# Patient Record
Sex: Male | Born: 1945 | Race: White | Hispanic: No | Marital: Married | Smoking: Never smoker
Health system: Southern US, Community
[De-identification: ages and names within clinical notes are randomized; demographics above are authoritative.]

## PROBLEM LIST (undated history)

## (undated) DIAGNOSIS — H3530 Unspecified macular degeneration: Secondary | ICD-10-CM

## (undated) DIAGNOSIS — N529 Male erectile dysfunction, unspecified: Secondary | ICD-10-CM

## (undated) DIAGNOSIS — E785 Hyperlipidemia, unspecified: Secondary | ICD-10-CM

## (undated) DIAGNOSIS — M1990 Unspecified osteoarthritis, unspecified site: Secondary | ICD-10-CM

## (undated) DIAGNOSIS — E119 Type 2 diabetes mellitus without complications: Secondary | ICD-10-CM

## (undated) HISTORY — PX: OTHER SURGICAL HISTORY: SHX169

## (undated) HISTORY — PX: EYE SURGERY: SHX253

## (undated) HISTORY — DX: Unspecified macular degeneration: H35.30

---

## 2012-09-22 ENCOUNTER — Other Ambulatory Visit: Payer: Self-pay | Admitting: Endocrinology

## 2012-09-22 DIAGNOSIS — E7800 Pure hypercholesterolemia, unspecified: Secondary | ICD-10-CM

## 2012-09-29 ENCOUNTER — Other Ambulatory Visit: Payer: Self-pay

## 2012-10-06 ENCOUNTER — Ambulatory Visit: Payer: Self-pay | Admitting: Endocrinology

## 2012-10-22 ENCOUNTER — Other Ambulatory Visit (INDEPENDENT_AMBULATORY_CARE_PROVIDER_SITE_OTHER): Payer: 59

## 2012-10-22 DIAGNOSIS — E7800 Pure hypercholesterolemia, unspecified: Secondary | ICD-10-CM

## 2012-10-22 LAB — LIPID PANEL
Total CHOL/HDL Ratio: 4
Triglycerides: 97 mg/dL (ref 0.0–149.0)

## 2012-10-22 LAB — COMPREHENSIVE METABOLIC PANEL
ALT: 17 U/L (ref 0–53)
AST: 18 U/L (ref 0–37)
Albumin: 3.9 g/dL (ref 3.5–5.2)
Alkaline Phosphatase: 51 U/L (ref 39–117)
Chloride: 106 mEq/L (ref 96–112)
Potassium: 4.4 mEq/L (ref 3.5–5.1)
Sodium: 140 mEq/L (ref 135–145)
Total Protein: 6.3 g/dL (ref 6.0–8.3)

## 2012-10-22 LAB — LDL CHOLESTEROL, DIRECT: Direct LDL: 138.1 mg/dL

## 2012-10-29 ENCOUNTER — Encounter: Payer: Self-pay | Admitting: Endocrinology

## 2012-10-29 ENCOUNTER — Ambulatory Visit (INDEPENDENT_AMBULATORY_CARE_PROVIDER_SITE_OTHER): Payer: 59 | Admitting: Endocrinology

## 2012-10-29 VITALS — BP 140/84 | HR 81 | Temp 98.8°F | Resp 12 | Ht 72.0 in | Wt 182.9 lb

## 2012-10-29 DIAGNOSIS — N529 Male erectile dysfunction, unspecified: Secondary | ICD-10-CM

## 2012-10-29 DIAGNOSIS — E1169 Type 2 diabetes mellitus with other specified complication: Secondary | ICD-10-CM

## 2012-10-29 DIAGNOSIS — IMO0001 Reserved for inherently not codable concepts without codable children: Secondary | ICD-10-CM

## 2012-10-29 DIAGNOSIS — E7800 Pure hypercholesterolemia, unspecified: Secondary | ICD-10-CM

## 2012-10-29 LAB — HEMOGLOBIN A1C: Hgb A1c MFr Bld: 6.9 % — ABNORMAL HIGH (ref 4.6–6.5)

## 2012-10-29 MED ORDER — SILDENAFIL CITRATE 100 MG PO TABS: 100.0000 mg | ORAL_TABLET | Freq: Every day | ORAL | Status: DC | PRN

## 2012-10-29 NOTE — Progress Notes (Signed)
Patient ID: Juan Morales, male   DOB: 15-Oct-1945, 67 y.o.   MRN: [id number]  MASAYUKI SAKAI is an 67 y.o. male.   Reason for Appointment: Diabetes follow-up   History of Present Illness   Diagnosis: Type 2 DIABETES MELITUS     He has had relatively mild diabetes which is well-controlled with low-dose Jentadueto He has been taking this for a couple of years, started by his previous physician He has generally been compliant with taking this twice a day On his last visit he was suggested Janumet for convenience and taking one a day but he has not filled the prescription He is generally checking his blood sugars only in the morning and only rarely after meals His A1c has been usually under 7%   Side effects from medications: None Monitors blood glucose: Once a day or less.    Glucometer: Accucheck        Blood Glucose readings: readings before breakfast: 112-140; [facility] pc   Hypoglycemia frequency: Never.          Meals: 3 meals per day.  he thinks he is watching his portions fairly well         Physical activity: exercise: 25 mi/wk             Wt Readings from Last 3 Encounters:  10/29/12 182 lb 14.4 oz (82.963 kg)    Office Visit on 10/29/2012  Component Date Value Range Status  . Hemoglobin A1C 10/29/2012 6.9* 4.6 - 6.5 % Final   Glycemic Control Guidelines for People with Diabetes:Non Diabetic:  <6%Goal of Therapy: <7%Additional Action Suggested:  >8%       Medication List       This list is accurate as of: 10/29/12 11:59 PM.  Always use your most recent med list.               JENTADUETO 2.5-500 MG Tabs  Generic drug:  Linagliptin-Metformin HCl     sildenafil 100 MG tablet  Commonly known as:  VIAGRA  Take 1 tablet (100 mg total) by mouth daily as needed for erectile dysfunction.     SitaGLIPtin-MetFORMIN HCl [phone] MG Tb24  Take 100-1,000 mg by mouth daily.        Allergies: No Known Allergies  No past medical history on file.  No past surgical history on  file.  No family history on file.  Social History:  reports that he has never smoked. He does not have any smokeless tobacco history on file. His alcohol and drug histories are not on file.  Review of Systems:  No history of hypertension  HYPERLIPIDEMIA: The lipid abnormality consists of elevated LDL. He has been informed of his hypercholesterolemia on his previous visits He has been reluctant to take any medications and still insists on wanting to try diet before medications  No history of tingling or numbness in his feet  He has had long-standing erectile dysfunction, previously has tried Viagra with benefit. He wants to get a prescription for 20 tablets per month to be ordered from [place]      Examination:   BP 140/84  Pulse 81  Temp(Src) 98.8 F (37.1 C)  Resp 12  Ht 6' (1.829 m)  Wt 182 lb 14.4 oz (82.963 kg)  BMI 24.8 kg/m2  SpO2 99%  Body mass index is 24.8 kg/(m^2).   ASSESSMENT/ PLAN::   Diabetes type 2   The patient's diabetes control appears to be fairly well controlled although he does not  monitor after meals Will need to check A1c today  HYPERLIPIDEMIA: LDL is significantly high but he refuses medications. Discussed benefits of statin drugs regardless of baseline cholesterol level and given detailed information on low saturated fat diet, benefits of statin drugs, diabetes and heart disease  Lailie Smead 10/31/2012, 12:54 PM

## 2012-10-29 NOTE — Patient Instructions (Signed)
Please check blood sugars at least half the time about 2 hours after any meal and as directed on waking up.   Please bring blood sugar monitor to each visit  

## 2012-11-10 ENCOUNTER — Encounter: Payer: Self-pay | Admitting: Endocrinology

## 2012-11-10 ENCOUNTER — Telehealth: Payer: Self-pay | Admitting: *Deleted

## 2012-11-10 MED ORDER — SITAGLIP PHOS-METFORMIN HCL ER 100-1000 MG PO TB24: 100.0000 mg | ORAL_TABLET | Freq: Every day | ORAL | Status: DC

## 2012-11-10 NOTE — Telephone Encounter (Signed)
rx sent

## 2012-12-03 ENCOUNTER — Encounter: Payer: Self-pay | Admitting: Endocrinology

## 2013-01-29 ENCOUNTER — Other Ambulatory Visit: Payer: Self-pay

## 2013-02-02 ENCOUNTER — Other Ambulatory Visit: Payer: 59

## 2013-02-09 ENCOUNTER — Ambulatory Visit: Payer: 59 | Admitting: Endocrinology

## 2013-02-23 ENCOUNTER — Other Ambulatory Visit (INDEPENDENT_AMBULATORY_CARE_PROVIDER_SITE_OTHER): Payer: 59

## 2013-02-23 DIAGNOSIS — IMO0001 Reserved for inherently not codable concepts without codable children: Secondary | ICD-10-CM

## 2013-02-23 DIAGNOSIS — E7800 Pure hypercholesterolemia, unspecified: Secondary | ICD-10-CM

## 2013-02-23 LAB — MICROALBUMIN / CREATININE URINE RATIO
Creatinine,U: 158.5 mg/dL
Microalb, Ur: 1.2 mg/dL (ref 0.0–1.9)

## 2013-02-23 LAB — LIPID PANEL
HDL: 57.2 mg/dL (ref >39.00)
Total CHOL/HDL Ratio: 4
VLDL: 17.8 mg/dL (ref 0.0–40.0)

## 2013-02-23 LAB — URINALYSIS, ROUTINE W REFLEX MICROSCOPIC
Bilirubin Urine: NEGATIVE
Hgb urine dipstick: NEGATIVE
Ketones, ur: NEGATIVE
RBC / HPF: NONE SEEN (ref 0–?)
Urine Glucose: NEGATIVE
Urobilinogen, UA: 0.2 (ref 0.0–1.0)

## 2013-02-23 LAB — BASIC METABOLIC PANEL
CO2: 28 mEq/L (ref 19–32)
Chloride: 107 mEq/L (ref 96–112)
Creatinine, Ser: 1.1 mg/dL (ref 0.4–1.5)

## 2013-02-23 LAB — LDL CHOLESTEROL, DIRECT: Direct LDL: 151.2 mg/dL

## 2013-03-02 ENCOUNTER — Ambulatory Visit: Payer: 59 | Admitting: Endocrinology

## 2013-03-09 ENCOUNTER — Encounter: Payer: Self-pay | Admitting: Endocrinology

## 2013-03-09 ENCOUNTER — Ambulatory Visit (INDEPENDENT_AMBULATORY_CARE_PROVIDER_SITE_OTHER): Payer: 59 | Admitting: Endocrinology

## 2013-03-09 VITALS — BP 126/80 | HR 93 | Temp 98.5°F | Resp 12 | Ht 72.5 in | Wt 188.9 lb

## 2013-03-09 DIAGNOSIS — E7800 Pure hypercholesterolemia, unspecified: Secondary | ICD-10-CM

## 2013-03-09 DIAGNOSIS — IMO0001 Reserved for inherently not codable concepts without codable children: Secondary | ICD-10-CM

## 2013-03-09 NOTE — Patient Instructions (Signed)
Low saturated fat diet  Please check blood sugars at least half the time about 2 hours after any meal and as directed on waking up. Please bring blood sugar monitor to each visit

## 2013-03-09 NOTE — Progress Notes (Signed)
Patient ID: Juan Morales, male   DOB: [birth year], 67 y.o.   MRN: [id number]  Reason for Appointment: Diabetes follow-up   History of Present Illness   Diagnosis: Type 2 DIABETES MELITUS  He was initially diagnosed with prediabetes in 2003. Previously had relatively mild diabetes which was well-controlled with low-dose Jentadueto  For convenience of once a day medication he was switched to Janumet XR and he is taking this now. His A1c has been usually under 7%, in 5/14 was 6.5 However he has been quite noncompliant with his diet and has gained weight since his last visit He thinks that he is eating more sweets and not following his meal plan He is generally checking his blood sugars only in the morning and only rarely after meals Side effects from medications: None  Monitors blood glucose: Once a day or less.    Glucometer: Accucheck        Blood Glucose readings: Fasting about 126, highest upto 160 Hypoglycemia frequency: Never.          Meals: 3 meals per day.  Physical activity: exercise: Walking 25 miles/week            Wt Readings from Last 3 Encounters:  03/09/13 188 lb 14.4 oz (85.684 kg)  10/29/12 182 lb 14.4 oz (82.963 kg)   Lab Results  Component Value Date   HGBA1C 7.0* 02/23/2013   HGBA1C 6.9* 10/29/2012   Lab Results  Component Value Date   MICROALBUR 1.2 02/23/2013   CREATININE 1.1 02/23/2013    No visits with results within 1 Week(s) from this visit. Latest known visit with results is:  Appointment on 02/23/2013  Component Date Value Range Status  . Cholesterol 02/23/2013 229* 0 - 200 mg/dL Final   ATP III Classification       Desirable:  < 200 mg/dL               Borderline High:  200 - 239 mg/dL          High:  > = 409 mg/dL  . Triglycerides 02/23/2013 89.0  0.0 - 149.0 mg/dL Final   Normal:  <811 mg/dLBorderline High:  150 - 199 mg/dL  . HDL 02/23/2013 57.20  >39.00 mg/dL Final  . VLDL 91/47/8295 17.8  0.0 - 40.0 mg/dL Final  . Total CHOL/HDL Ratio  02/23/2013 4   Final                  Men          Women1/2 Average Risk     3.4          3.3Average Risk          5.0          4.42X Average Risk          9.6          7.13X Average Risk          15.0          11.0                      . Microalb, Ur 02/23/2013 1.2  0.0 - 1.9 mg/dL Final  . Creatinine,U 62/13/0865 158.5   Final  . Microalb Creat Ratio 02/23/2013 0.8  0.0 - 30.0 mg/g Final  . Color, Urine 02/23/2013 LT. YELLOW  Yellow;Lt. Yellow Final  . APPearance 02/23/2013 CLEAR  Clear Final  . Specific Gravity, Urine 02/23/2013 1.025  1.000-1.030 Final  .  pH 02/23/2013 6.0  5.0 - 8.0 Final  . Total Protein, Urine 02/23/2013 NEGATIVE  Negative Final  . Urine Glucose 02/23/2013 NEGATIVE  Negative Final  . Ketones, ur 02/23/2013 NEGATIVE  Negative Final  . Bilirubin Urine 02/23/2013 NEGATIVE  Negative Final  . Hgb urine dipstick 02/23/2013 NEGATIVE  Negative Final  . Urobilinogen, UA 02/23/2013 0.2  0.0 - 1.0 Final  . Leukocytes, UA 02/23/2013 NEGATIVE  Negative Final  . Nitrite 02/23/2013 NEGATIVE  Negative Final  . WBC, UA 02/23/2013 0-2/hpf  0-2/hpf Final  . RBC / HPF 02/23/2013 none seen  0-2/hpf Final  . Sodium 02/23/2013 142  135 - 145 mEq/L Final  . Potassium 02/23/2013 4.2  3.5 - 5.1 mEq/L Final  . Chloride 02/23/2013 107  96 - 112 mEq/L Final  . CO2 02/23/2013 28  19 - 32 mEq/L Final  . Glucose, Bld 02/23/2013 166* 70 - 99 mg/dL Final  . BUN 16/12/9602 20  6 - 23 mg/dL Final  . Creatinine, Ser 02/23/2013 1.1  0.4 - 1.5 mg/dL Final  . Calcium 54/11/8117 9.3  8.4 - 10.5 mg/dL Final  . GFR 14/78/2956 72.32  >60.00 mL/min Final  . Hemoglobin A1C 02/23/2013 7.0* 4.6 - 6.5 % Final   Glycemic Control Guidelines for People with Diabetes:Non Diabetic:  <6%Goal of Therapy: <7%Additional Action Suggested:  >8%   . Direct LDL 02/23/2013 151.2   Final   Optimal:  <100 mg/dLNear or Above Optimal:  100-129 mg/dLBorderline High:  130-159 mg/dLHigh:  160-189 mg/dLVery High:  >190 mg/dL       Medication List       This list is accurate as of: 03/09/13  1:02 PM.  Always use your most recent med list.               sildenafil 100 MG tablet  Commonly known as:  VIAGRA  Take 1 tablet (100 mg total) by mouth daily as needed for erectile dysfunction.     SitaGLIPtin-MetFORMIN HCl [phone] MG Tb24  Take 100-1,000 mg by mouth daily.     tobramycin 0.3 % ophthalmic solution  Commonly known as:  TOBREX        Allergies: No Known Allergies  No past medical history on file.  No past surgical history on file.  No family history on file.  Social History:  reports that he has never smoked. He does not have any smokeless tobacco history on file. His alcohol and drug histories are not on file.  Review of Systems:  No history of hypertension  HYPERLIPIDEMIA: The lipid abnormality consists of elevated LDL. He has been informed of his persistent hypercholesterolemia on each visit.  He has been reluctant to take any medications and again insists on wanting to try diet before medications. This is despite explaining him the risks of cardiovascular disease with having diabetes and hypercholesterolemia.  He has had long-standing erectile dysfunction, previously has tried Viagra with benefit. He usually will get a prescription for 20 tablets to be ordered from [place]     Examination:   BP 126/80  Pulse 93  Temp(Src) 98.5 F (36.9 C)  Resp 12  Ht 6' 0.5" (1.842 m)  Wt 188 lb 14.4 oz (85.684 kg)  BMI 25.25 kg/m2  SpO2 98%  Body mass index is 25.25 kg/(m^2).   ASSESSMENT/ PLAN::   Diabetes type 2   The patient's diabetes control appears to be only fair with A1c 7%. Recently has gained weight and is totally of his diet. This is  despite having diet instructions in the past He does exercise regularly Usually he does not monitor after meals He again would like to try improving his diet before increasing the dose of his medication  HYPERLIPIDEMIA: LDL is significantly high  but he again refuses medication. Discussed benefits of statin drugs regardless of baseline cholesterol level and need to reduce his risk. He will reconsider this in 6 months  Arna Luis 03/09/2013, 1:02 PM

## 2013-03-10 ENCOUNTER — Encounter: Payer: Self-pay | Admitting: Endocrinology

## 2013-09-21 ENCOUNTER — Encounter: Payer: Self-pay | Admitting: Endocrinology

## 2013-09-23 ENCOUNTER — Other Ambulatory Visit (INDEPENDENT_AMBULATORY_CARE_PROVIDER_SITE_OTHER): Payer: 59

## 2013-09-23 DIAGNOSIS — E1165 Type 2 diabetes mellitus with hyperglycemia: Principal | ICD-10-CM

## 2013-09-23 DIAGNOSIS — IMO0001 Reserved for inherently not codable concepts without codable children: Secondary | ICD-10-CM

## 2013-09-23 LAB — COMPREHENSIVE METABOLIC PANEL
ALK PHOS: 44 U/L (ref 39–117)
ALT: 16 U/L (ref 0–53)
AST: 17 U/L (ref 0–37)
Albumin: 3.9 g/dL (ref 3.5–5.2)
BILIRUBIN TOTAL: 1 mg/dL (ref 0.2–1.2)
BUN: 22 mg/dL (ref 6–23)
CO2: 28 mEq/L (ref 19–32)
CREATININE: 1 mg/dL (ref 0.4–1.5)
Calcium: 9.2 mg/dL (ref 8.4–10.5)
Chloride: 108 mEq/L (ref 96–112)
GFR: 78.9 mL/min (ref >60.00)
Glucose, Bld: 149 mg/dL — ABNORMAL HIGH (ref 70–99)
Potassium: 4 mEq/L (ref 3.5–5.1)
Sodium: 141 mEq/L (ref 135–145)
TOTAL PROTEIN: 6.6 g/dL (ref 6.0–8.3)

## 2013-09-23 LAB — HEMOGLOBIN A1C: HEMOGLOBIN A1C: 7.6 % — AB (ref 4.6–6.5)

## 2013-09-24 ENCOUNTER — Other Ambulatory Visit: Payer: Self-pay | Admitting: *Deleted

## 2013-09-24 ENCOUNTER — Ambulatory Visit (INDEPENDENT_AMBULATORY_CARE_PROVIDER_SITE_OTHER): Payer: 59 | Admitting: Endocrinology

## 2013-09-24 ENCOUNTER — Encounter: Payer: Self-pay | Admitting: Endocrinology

## 2013-09-24 VITALS — BP 148/92 | HR 71 | Temp 98.1°F | Resp 16 | Ht 72.6 in | Wt 186.8 lb

## 2013-09-24 DIAGNOSIS — E7800 Pure hypercholesterolemia, unspecified: Secondary | ICD-10-CM

## 2013-09-24 DIAGNOSIS — N521 Erectile dysfunction due to diseases classified elsewhere: Secondary | ICD-10-CM

## 2013-09-24 DIAGNOSIS — N529 Male erectile dysfunction, unspecified: Secondary | ICD-10-CM

## 2013-09-24 DIAGNOSIS — E1169 Type 2 diabetes mellitus with other specified complication: Secondary | ICD-10-CM

## 2013-09-24 DIAGNOSIS — IMO0001 Reserved for inherently not codable concepts without codable children: Secondary | ICD-10-CM

## 2013-09-24 DIAGNOSIS — R030 Elevated blood-pressure reading, without diagnosis of hypertension: Secondary | ICD-10-CM

## 2013-09-24 DIAGNOSIS — E1165 Type 2 diabetes mellitus with hyperglycemia: Principal | ICD-10-CM

## 2013-09-24 MED ORDER — DULAGLUTIDE 0.75 MG/0.5ML ~~LOC~~ SOAJ: SUBCUTANEOUS | Status: DC

## 2013-09-24 MED ORDER — METFORMIN HCL ER 750 MG PO TB24: ORAL_TABLET | ORAL | Status: DC

## 2013-09-24 MED ORDER — SILDENAFIL CITRATE 100 MG PO TABS: 100.0000 mg | ORAL_TABLET | Freq: Every day | ORAL | Status: DC | PRN

## 2013-09-24 MED ORDER — GLUCOSE BLOOD VI STRP: ORAL_STRIP | Status: DC

## 2013-09-24 NOTE — Patient Instructions (Addendum)
Please check blood sugars at least half the time about 2 hours after any meal and times per week on waking up. Please bring blood sugar monitor to each visit Stop Janumet when finished and switch to metformin ER 2 tablets  Trulicity 0.75 mg as shown today, take 1x weekly  Check BP weekly Check more Primary care Dr [place]

## 2013-09-24 NOTE — Progress Notes (Signed)
Patient ID: Juan MarionGary R Morales, male   DOB: 03/22/1946, 68 y.o.   MRN: [id number]   Reason for Appointment: Diabetes follow-up   History of Present Illness   Diagnosis: Type 2 DIABETES MELITUS  He was initially diagnosed with prediabetes in 2003. Previously had relatively mild diabetes which was well-controlled with low-dose Jentadueto  For convenience of once a day medication he was switched to Janumet XR 100/1000 once a day His A1c had been usually under 7%, in 5/14 was 6.5 However since his last visit 6 months ago his A1c has gone up to 7.6% On his last visit he was also not well controlled and was admitting significant difficulty following his diet Again he has been quite noncompliant with his diet and blood sugars are higher This is despite his continuing a regular walking program He did not bring his monitor for download and not clear how often he is checking Most likely has postprandial hyperglycemia since fasting readings are generally less than 150  He thinks that he is eating more sweets and not following his meal plan He is generally checking his blood sugars only in the morning and only rarely after meals as discussed previously Side effects from medications: None  Monitors blood glucose: Once a day or less.    Glucometer: Accucheck        Blood Glucose readings: Fasting 135-140 recently, highest upto 170  Hypoglycemia frequency: Never.          Meals: 3 meals per day.  Physical activity: exercise: Walking 25 miles/week            Wt Readings from Last 3 Encounters:  09/24/13 186 lb 12.8 oz (84.732 kg)  03/09/13 188 lb 14.4 oz (85.684 kg)  10/29/12 182 lb 14.4 oz (82.963 kg)   Lab Results  Component Value Date   HGBA1C 7.6* 09/23/2013   HGBA1C 7.0* 02/23/2013   HGBA1C 6.9* 10/29/2012   Lab Results  Component Value Date   MICROALBUR 1.2 02/23/2013   CREATININE 1.0 09/23/2013    Appointment on 09/23/2013  Component Date Value Ref Range Status  . Hemoglobin A1C 09/23/2013  7.6* 4.6 - 6.5 % Final   Glycemic Control Guidelines for People with Diabetes:Non Diabetic:  <6%Goal of Therapy: <7%Additional Action Suggested:  >8%   . Sodium 09/23/2013 141  135 - 145 mEq/L Final  . Potassium 09/23/2013 4.0  3.5 - 5.1 mEq/L Final  . Chloride 09/23/2013 108  96 - 112 mEq/L Final  . CO2 09/23/2013 28  19 - 32 mEq/L Final  . Glucose, Bld 09/23/2013 149* 70 - 99 mg/dL Final  . BUN 40/10/272507/03/2013 22  6 - 23 mg/dL Final  . Creatinine, Ser 09/23/2013 1.0  0.4 - 1.5 mg/dL Final  . Total Bilirubin 09/23/2013 1.0  0.2 - 1.2 mg/dL Final  . Alkaline Phosphatase 09/23/2013 44  39 - 117 U/L Final  . AST 09/23/2013 17  0 - 37 U/L Final  . ALT 09/23/2013 16  0 - 53 U/L Final  . Total Protein 09/23/2013 6.6  6.0 - 8.3 g/dL Final  . Albumin 36/64/403407/03/2013 3.9  3.5 - 5.2 g/dL Final  . Calcium 74/25/956307/03/2013 9.2  8.4 - 10.5 mg/dL Final  . GFR 87/56/433207/03/2013 78.90  >60.00 mL/min Final      Medication List       This list is accurate as of: 09/24/13 11:59 PM.  Always use your most recent med list.  Dulaglutide 0.75 MG/0.5ML Sopn  Commonly known as:  TRULICITY  Inject once a week     glucose blood test strip  Commonly known as:  ACCU-CHEK COMPACT TEST DRUM  Use as instructed to check blood sugar daily 250.02     metFORMIN 750 MG 24 hr tablet  Commonly known as:  GLUCOPHAGE XR  Take 2 tablets daily     sildenafil 100 MG tablet  Commonly known as:  VIAGRA  Take 1 tablet (100 mg total) by mouth daily as needed for erectile dysfunction.     SitaGLIPtin-MetFORMIN HCl [phone] MG Tb24  Take 100-1,000 mg by mouth daily.     tobramycin 0.3 % ophthalmic solution  Commonly known as:  TOBREX        Allergies: No Known Allergies  No past medical history on file.  No past surgical history on file.  Family History  Problem Relation Age of Onset  . Diabetes Neg Hx     Social History:  reports that he has never smoked. He does not have any smokeless tobacco history on file.  His alcohol and drug histories are not on file.  Review of Systems:  No history of hypertension previously but his blood pressure is significantly high. He thinks that blood pressure at the drug store is usually fairly good but does not remember the readings  HYPERLIPIDEMIA: The lipid abnormality consists of elevated LDL. He has had a discussion about the persistent hypercholesterolemia on each visit.  He has been reluctant to take any medications and again insists on wanting to try diet before medications.  Again discussed the known benefits of statin drugs in reducing cardiovascular disease in patients with diabetes and hypercholesterolemia.  Lab Results  Component Value Date   CHOL 229* 02/23/2013   HDL 57.20 02/23/2013   LDLDIRECT 151.2 02/23/2013   TRIG 89.0 02/23/2013   CHOLHDL 4 02/23/2013    He has had long-standing erectile dysfunction, previously has been using Viagra with benefit.   He was again given a prescription  As requested to be ordered from [place]     Examination:   BP 148/92  Pulse 71  Temp(Src) 98.1 F (36.7 C)  Resp 16  Ht 6' 0.6" (1.844 m)  Wt 186 lb 12.8 oz (84.732 kg)  BMI 24.92 kg/m2  SpO2 97%  Body mass index is 24.92 kg/(m^2).   ASSESSMENT/ PLAN:   Diabetes type 2   The patient's diabetes control appears to be only worsening with A1c usually over 7% and gradually increasing fair with A1c 7%. He has had consistent difficulty watching his diet. This is despite having diet instructions in the past He does exercise regularly Usually he does not monitor after meals to be aware of how high his blood sugars are going However discussed benefits of switching Januvia to a GLP-1 drug for multiple benefits and better control and should do well with simple once a week injection with Trulicity  Discussed with the patient the nature of GLP-1 drugs, the action on various organ systems, how they benefit blood glucose control, as well as the benefit of weight loss  and  increase satiety . Explained possible side effects especially nausea and vomiting; discussed safety information in package insert. Demonstrated the medication injection device and injection technique to the patient. Discussed injection sites and titration of Trulicity starting with 0.75 mg once a week for 2 weeks and possibly increasing to 1.5 mg if no symptoms of nausea. Patient brochure on Trulicity and co-pay card given  He will also need to bring his monitor for review on his followup in one month When out of Janumet he will switch to metformin ER  HYPERLIPIDEMIA: LDL has been significantly high but he again refuses medications. He will reconsider this on the next visit  Increased blood pressure: Discussed benefits of ACE inhibitors in patients with diabetes but he is reluctant to start any new medication. He monitor his blood pressure periodically at home and microalbumin will be checked on the next visit  Also discussed needing to establish with PCP for annual preventive exams  Counseling time over 50% of today's 25 minute visit  Patient Instructions  Please check blood sugars at least half the time about 2 hours after any meal and times per week on waking up. Please bring blood sugar monitor to each visit Stop Janumet when finished and switch to metformin ER 2 tablets  Trulicity 0.75 mg as shown today, take 1x weekly  Check BP weekly Check more Primary care Dr the office in [facility] 09/27/2013, 9:51 PM

## 2013-09-28 ENCOUNTER — Other Ambulatory Visit: Payer: Self-pay | Admitting: *Deleted

## 2013-09-28 MED ORDER — METFORMIN HCL ER 750 MG PO TB24: ORAL_TABLET | ORAL | Status: DC

## 2013-10-20 ENCOUNTER — Other Ambulatory Visit: Payer: 59

## 2013-10-21 ENCOUNTER — Other Ambulatory Visit (INDEPENDENT_AMBULATORY_CARE_PROVIDER_SITE_OTHER): Payer: 59

## 2013-10-21 DIAGNOSIS — E7800 Pure hypercholesterolemia, unspecified: Secondary | ICD-10-CM

## 2013-10-21 DIAGNOSIS — IMO0001 Reserved for inherently not codable concepts without codable children: Secondary | ICD-10-CM

## 2013-10-21 DIAGNOSIS — E1165 Type 2 diabetes mellitus with hyperglycemia: Secondary | ICD-10-CM

## 2013-10-21 LAB — LIPID PANEL
Cholesterol: 211 mg/dL — ABNORMAL HIGH (ref 0–200)
HDL: 52.6 mg/dL (ref >39.00)
LDL Cholesterol: 139 mg/dL — ABNORMAL HIGH (ref 0–99)
NonHDL: 158.4
Total CHOL/HDL Ratio: 4
Triglycerides: 99 mg/dL (ref 0.0–149.0)
VLDL: 19.8 mg/dL (ref 0.0–40.0)

## 2013-10-21 LAB — BASIC METABOLIC PANEL
BUN: 25 mg/dL — ABNORMAL HIGH (ref 6–23)
CO2: 29 mEq/L (ref 19–32)
Calcium: 9.5 mg/dL (ref 8.4–10.5)
Chloride: 107 mEq/L (ref 96–112)
Creatinine, Ser: 1.3 mg/dL (ref 0.4–1.5)
GFR: 57.77 mL/min — AB (ref >60.00)
Glucose, Bld: 119 mg/dL — ABNORMAL HIGH (ref 70–99)
Potassium: 3.9 mEq/L (ref 3.5–5.1)
SODIUM: 142 meq/L (ref 135–145)

## 2013-10-23 ENCOUNTER — Ambulatory Visit: Payer: 59 | Admitting: Endocrinology

## 2013-10-23 ENCOUNTER — Other Ambulatory Visit: Payer: Self-pay | Admitting: *Deleted

## 2013-10-23 ENCOUNTER — Encounter: Payer: Self-pay | Admitting: Endocrinology

## 2013-10-23 ENCOUNTER — Ambulatory Visit (INDEPENDENT_AMBULATORY_CARE_PROVIDER_SITE_OTHER): Payer: 59 | Admitting: Endocrinology

## 2013-10-23 VITALS — BP 120/79 | HR 83 | Temp 98.4°F | Resp 16 | Ht 72.0 in | Wt 178.8 lb

## 2013-10-23 DIAGNOSIS — E7800 Pure hypercholesterolemia, unspecified: Secondary | ICD-10-CM

## 2013-10-23 DIAGNOSIS — E119 Type 2 diabetes mellitus without complications: Secondary | ICD-10-CM

## 2013-10-23 LAB — FRUCTOSAMINE: FRUCTOSAMINE: 283 umol/L — AB (ref 190–270)

## 2013-10-23 MED ORDER — DULAGLUTIDE 0.75 MG/0.5ML ~~LOC~~ SOAJ: SUBCUTANEOUS | Status: DC

## 2013-10-23 NOTE — Patient Instructions (Signed)
Metformin 1 daily 

## 2013-10-23 NOTE — Progress Notes (Signed)
Patient ID: Juan Morales, male   DOB: 1945/05/08, 68 y.o.   MRN: [id number]   Reason for Appointment: Diabetes follow-up   History of Present Illness   Diagnosis: Type 2 DIABETES MELITUS  He was initially diagnosed with prediabetes in 2003. Previously had relatively mild diabetes which was well-controlled with low-dose Jentadueto For convenience of once a day medication he was switched to Janumet XR 100/1000 once a day His A1c had been usually under 7%, in 5/14 was 6.5  However since hisA1c had gone up to 7.6% in 7/15 he was recommended a change of treatment Overall he has been having persistent difficulties being noncompliant with his diet with making poor choices and eating sweets This is despite his continuing a regular walking program He was started on Trulicity  0.75 mg weekly for 4 weeks ago With this his blood sugars have improved considerably and in the last week or 2 to have been practically normal according to his home diary which he printed out. He has been checking his blood sugars fairly consistently twice a day and has some readings and evenings and after meals Previous home readings are ranging from 135-170 and now they are between 97-123 He did have significant nausea initially but this is improving He has cut back on his portion and has lost weight Side effects from medications:  nausea initially from Trulicity  Monitors blood glucose: Once a day or less.    Glucometer: Accucheck        Blood Glucose readings: Fasting 135-140 recently, highest upto 170 97-123  Hypoglycemia frequency: Never.          Meals: 3 meals per day.  Physical activity: exercise: Walking 25 miles/week            Wt Readings from Last 3 Encounters:  10/23/13 178 lb 12.8 oz (81.103 kg)  09/24/13 186 lb 12.8 oz (84.732 kg)  03/09/13 188 lb 14.4 oz (85.684 kg)   Lab Results  Component Value Date   HGBA1C 7.6* 09/23/2013   HGBA1C 7.0* 02/23/2013   HGBA1C 6.9* 10/29/2012   Lab Results  Component  Value Date   MICROALBUR 1.2 02/23/2013   LDLCALC 139* 10/21/2013   CREATININE 1.3 10/21/2013    Appointment on 10/21/2013  Component Date Value Ref Range Status  . Cholesterol 10/21/2013 211* 0 - 200 mg/dL Final   ATP III Classification       Desirable:  < 200 mg/dL               Borderline High:  200 - 239 mg/dL          High:  > = 409 mg/dL  . Triglycerides 10/21/2013 99.0  0.0 - 149.0 mg/dL Final   Normal:  <811 mg/dLBorderline High:  150 - 199 mg/dL  . HDL 10/21/2013 52.60  >39.00 mg/dL Final  . VLDL 91/47/8295 19.8  0.0 - 40.0 mg/dL Final  . LDL Cholesterol 10/21/2013 139* 0 - 99 mg/dL Final  . Total CHOL/HDL Ratio 10/21/2013 4   Final                  Men          Women1/2 Average Risk     3.4          3.3Average Risk          5.0          4.42X Average Risk          9.6  7.13X Average Risk          15.0          11.0                      . NonHDL 10/21/2013 158.40   Final   NOTE:  Non-HDL goal should be 30 mg/dL higher than patient's LDL goal (i.e. LDL goal of < 70 mg/dL, would have non-HDL goal of < 100 mg/dL)  . Sodium 10/21/2013 142  135 - 145 mEq/L Final  . Potassium 10/21/2013 3.9  3.5 - 5.1 mEq/L Final  . Chloride 10/21/2013 107  96 - 112 mEq/L Final  . CO2 10/21/2013 29  19 - 32 mEq/L Final  . Glucose, Bld 10/21/2013 119* 70 - 99 mg/dL Final  . BUN 47/82/9562 25* 6 - 23 mg/dL Final  . Creatinine, Ser 10/21/2013 1.3  0.4 - 1.5 mg/dL Final  . Calcium 13/10/6576 9.5  8.4 - 10.5 mg/dL Final  . GFR 46/96/2952 57.77* >60.00 mL/min Final  . Fructosamine 10/21/2013 283* 190 - 270 umol/L Final      Medication List       This list is accurate as of: 10/23/13 11:59 PM.  Always use your most recent med list.               Dulaglutide 0.75 MG/0.5ML Sopn  Commonly known as:  TRULICITY  Inject once a week     glucose blood test strip  Commonly known as:  ACCU-CHEK COMPACT TEST DRUM  Use as instructed to check blood sugar daily 250.02     metFORMIN 750 MG 24 hr  tablet  Commonly known as:  GLUCOPHAGE XR  Take 2 tablets daily     sildenafil 100 MG tablet  Commonly known as:  VIAGRA  Take 1 tablet (100 mg total) by mouth daily as needed for erectile dysfunction.        Allergies: No Known Allergies  No past medical history on file.  No past surgical history on file.  Family History  Problem Relation Age of Onset  . Diabetes Neg Hx     Social History:  reports that he has never smoked. He does not have any smokeless tobacco history on file. His alcohol and drug histories are not on file.  Review of Systems:   No history of hypertension  but his blood pressure has been on his last 2 visits significantly high. He has monitored his blood pressure regularly at home and his record shows a range of 114-131/77-87  HYPERLIPIDEMIA: The lipid abnormality consists of elevated LDL. He has had a discussion about the persistent hypercholesterolemia and recommended treatment for reducing CV risk on each visit.  He has been reluctant to take any medications and again wants to try diet before medications.    Lab Results  Component Value Date   CHOL 211* 10/21/2013   HDL 52.60 10/21/2013   LDLCALC 139* 10/21/2013   LDLDIRECT 151.2 02/23/2013   TRIG 99.0 10/21/2013   CHOLHDL 4 10/21/2013    He has had long-standing erectile dysfunction, previously has been using Viagra with benefit.       Examination:   BP 120/79  Pulse 83  Temp(Src) 98.4 F (36.9 C)  Resp 16  Ht 6' (1.829 m)  Wt 178 lb 12.8 oz (81.103 kg)  BMI 24.24 kg/m2  SpO2 99%  Body mass index is 24.24 kg/(m^2).   ASSESSMENT/ PLAN:   Diabetes type 2   The patient's  diabetes control appears to be excellent with adding Trulicity 0.75 mg Although fructosamine is moderately increased his home glucoses are recently near normal He has tolerated this well recently although initially had significant nausea Also taking metformin 1500 mg a day, previously on 1000 mg a day Since he has had  near normal blood sugars and improved blood pressure and weight he agrees to continue this He will try using only 750 mg of metformin and blood sugars go up he can increase the dose again Encouraged him to continue compliance with diet and exercise   HYPERLIPIDEMIA: LDL has been significantly high, now slightly better with the level of 139 He will reconsider starting treatments if he is not at goal  Increased blood pressure: Improved with weight loss  Patient Instructions  Metformin 1 daily     Liboria Putnam 10/24/2013, 4:32 PM

## 2013-12-16 ENCOUNTER — Other Ambulatory Visit: Payer: Self-pay | Admitting: Endocrinology

## 2013-12-17 MED ORDER — METFORMIN HCL ER 750 MG PO TB24: ORAL_TABLET | ORAL | Status: DC

## 2014-02-16 ENCOUNTER — Other Ambulatory Visit (INDEPENDENT_AMBULATORY_CARE_PROVIDER_SITE_OTHER): Payer: 59

## 2014-02-16 DIAGNOSIS — E119 Type 2 diabetes mellitus without complications: Secondary | ICD-10-CM

## 2014-02-16 DIAGNOSIS — E7800 Pure hypercholesterolemia, unspecified: Secondary | ICD-10-CM

## 2014-02-16 LAB — LIPID PANEL
CHOL/HDL RATIO: 4
Cholesterol: 233 mg/dL — ABNORMAL HIGH (ref 0–200)
HDL: 56.2 mg/dL (ref >39.00)
LDL Cholesterol: 152 mg/dL — ABNORMAL HIGH (ref 0–99)
NONHDL: 176.8
Triglycerides: 124 mg/dL (ref 0.0–149.0)
VLDL: 24.8 mg/dL (ref 0.0–40.0)

## 2014-02-16 LAB — LDL CHOLESTEROL, DIRECT: LDL DIRECT: 144.3 mg/dL

## 2014-02-16 LAB — HEMOGLOBIN A1C: Hgb A1c MFr Bld: 6.7 % — ABNORMAL HIGH (ref 4.6–6.5)

## 2014-02-16 LAB — MICROALBUMIN / CREATININE URINE RATIO
Creatinine,U: 356.3 mg/dL
MICROALB UR: 1.9 mg/dL (ref 0.0–1.9)
Microalb Creat Ratio: 0.5 mg/g (ref 0.0–30.0)

## 2014-02-19 ENCOUNTER — Ambulatory Visit: Payer: 59 | Admitting: Endocrinology

## 2014-02-22 ENCOUNTER — Ambulatory Visit: Payer: 59 | Admitting: Endocrinology

## 2014-02-22 ENCOUNTER — Telehealth: Payer: Self-pay | Admitting: Endocrinology

## 2014-02-22 NOTE — Telephone Encounter (Signed)
Patient no showed today's appt. Please advise on how to follow up. °A. No follow up necessary. °B. Follow up urgent. Contact patient immediately. °C. Follow up necessary. Contact patient and schedule visit in ___ days. °D. Follow up advised. Contact patient and schedule visit in ____weeks. ° °

## 2014-02-23 NOTE — Telephone Encounter (Signed)
Follow up advised. Contact patient and schedule visit asap 

## 2014-02-25 ENCOUNTER — Ambulatory Visit (INDEPENDENT_AMBULATORY_CARE_PROVIDER_SITE_OTHER): Payer: 59 | Admitting: Endocrinology

## 2014-02-25 ENCOUNTER — Encounter: Payer: Self-pay | Admitting: Endocrinology

## 2014-02-25 VITALS — BP 132/86 | HR 76 | Temp 98.1°F | Resp 14 | Ht 72.0 in | Wt 182.4 lb

## 2014-02-25 DIAGNOSIS — E7800 Pure hypercholesterolemia, unspecified: Secondary | ICD-10-CM

## 2014-02-25 DIAGNOSIS — E119 Type 2 diabetes mellitus without complications: Secondary | ICD-10-CM

## 2014-02-25 MED ORDER — SILDENAFIL CITRATE 100 MG PO TABS: 100.0000 mg | ORAL_TABLET | Freq: Every day | ORAL | Status: DC | PRN

## 2014-02-25 MED ORDER — PRAVASTATIN SODIUM 40 MG PO TABS: 40.0000 mg | ORAL_TABLET | Freq: Every day | ORAL | Status: DC

## 2014-02-25 NOTE — Patient Instructions (Addendum)
Please check blood sugars at least half the time about 2 hours after any meal and 3 times per week on waking up.  Please bring blood sugar monitor to each visit  Start Pravastin for lipids

## 2014-02-25 NOTE — Progress Notes (Signed)
Patient ID: Juan Morales, male   DOB: [birth year], 68 y.o.   MRN: [id number]   Reason for Appointment: Diabetes follow-up   History of Present Illness   Diagnosis: Type 2 DIABETES MELITUS  He was initially diagnosed with prediabetes in 2003. Previously had relatively mild diabetes which was well-controlled with low-dose Jentadueto For convenience of once a day medication he was switched to Janumet XR 100/1000 once a day His A1c had been usually under 7%   When his A1c had gone up to 7.6% in 7/15 he was recommended a change of treatment and started on Trulicity  0.75 mg weekly.  With this his blood sugars have improved His A1c has improved further now On his last visit he had been very compliant with his diet but now he is going off again and eating a lot of desserts He does try to walk fairly regularly Overall he has been having persistent difficulties being noncompliant with his diet  Weight has gone back up again about 4 pounds. He has not had any difficulties with Trulicity and he thinks it helps him watch portions He also continues to take 1500 mg of metformin without any side effects  Side effects from medications:  none  Monitors blood glucose: Once a day or less.    Glucometer: Accucheck        Blood Glucose readings: Fasting 110-120, not checking postprandial  Hypoglycemia frequency: Never.          Meals: 3 meals per day.  Physical activity: exercise: Walking 25 miles/week or golf            Wt Readings from Last 3 Encounters:  02/25/14 182 lb 6.4 oz (82.736 kg)  10/23/13 178 lb 12.8 oz (81.103 kg)  09/24/13 186 lb 12.8 oz (84.732 kg)   Lab Results  Component Value Date   HGBA1C 6.7* 02/16/2014   HGBA1C 7.6* 09/23/2013   HGBA1C 7.0* 02/23/2013   Lab Results  Component Value Date   MICROALBUR 1.9 02/16/2014   LDLCALC 152* 02/16/2014   CREATININE 1.3 10/21/2013    No visits with results within 1 Week(s) from this visit. Latest known visit with results  is:  Appointment on 02/16/2014  Component Date Value Ref Range Status  . Hgb A1c MFr Bld 02/16/2014 6.7* 4.6 - 6.5 % Final   Glycemic Control Guidelines for People with Diabetes:Non Diabetic:  <6%Goal of Therapy: <7%Additional Action Suggested:  >8%   . Direct LDL 02/16/2014 144.3   Final   Optimal:  <100 mg/dLNear or Above Optimal:  100-129 mg/dLBorderline High:  130-159 mg/dLHigh:  160-189 mg/dLVery High:  >190 mg/dL  . Cholesterol 02/16/2014 233* 0 - 200 mg/dL Final   ATP III Classification       Desirable:  < 200 mg/dL               Borderline High:  200 - 239 mg/dL          High:  > = 409 mg/dL  . Triglycerides 02/16/2014 124.0  0.0 - 149.0 mg/dL Final   Normal:  <811 mg/dLBorderline High:  150 - 199 mg/dL  . HDL 02/16/2014 56.20  >39.00 mg/dL Final  . VLDL 91/47/8295 24.8  0.0 - 40.0 mg/dL Final  . LDL Cholesterol 02/16/2014 152* 0 - 99 mg/dL Final  . Total CHOL/HDL Ratio 02/16/2014 4   Final                  Men  Women1/2 Average Risk     3.4          3.3Average Risk          5.0          4.42X Average Risk          9.6          7.13X Average Risk          15.0          11.0                      . NonHDL 02/16/2014 176.80   Final   NOTE:  Non-HDL goal should be 30 mg/dL higher than patient's LDL goal (i.e. LDL goal of < 70 mg/dL, would have non-HDL goal of < 100 mg/dL)  . Microalb, Ur 02/16/2014 1.9  0.0 - 1.9 mg/dL Final  . Creatinine,U 04/54/098111/24/2015 356.3   Final  . Microalb Creat Ratio 02/16/2014 0.5  0.0 - 30.0 mg/g Final      Medication List       This list is accurate as of: 02/25/14  2:10 PM.  Always use your most recent med list.               Dulaglutide 0.75 MG/0.5ML Sopn  Commonly known as:  TRULICITY  Inject once a week     glucose blood test strip  Commonly known as:  ACCU-CHEK COMPACT TEST DRUM  Use as instructed to check blood sugar daily 250.02     metFORMIN 750 MG 24 hr tablet  Commonly known as:  GLUCOPHAGE XR  Take 2 tablets daily      pravastatin 40 MG tablet  Commonly known as:  PRAVACHOL  Take 1 tablet (40 mg total) by mouth daily.     sildenafil 100 MG tablet  Commonly known as:  VIAGRA  Take 1 tablet (100 mg total) by mouth daily as needed for erectile dysfunction.        Allergies: No Known Allergies  No past medical history on file.  No past surgical history on file.  Family History  Problem Relation Age of Onset  . Diabetes Neg Hx   . Heart disease Neg Hx   . Stroke Neg Hx   . Hypertension Father     Social History:  reports that he has never smoked. He does not have any smokeless tobacco history on file. His alcohol and drug histories are not on file.  Review of Systems:  Has macular degen  No history of hypertension  but his blood pressure has been on his last 2 visits significantly high. He has not monitored his blood pressure   HYPERLIPIDEMIA: The lipid abnormality consists of elevated LDL. He has had a discussion about the persistent hypercholesterolemia and recommended treatment for reducing CV risk on each visit.  He has been reluctant to take any medications and again wants to try diet before medications.    Lab Results  Component Value Date   CHOL 233* 02/16/2014   HDL 56.20 02/16/2014   LDLCALC 152* 02/16/2014   LDLDIRECT 144.3 02/16/2014   TRIG 124.0 02/16/2014   CHOLHDL 4 02/16/2014    He has had long-standing erectile dysfunction, previously has been using Viagra with benefit.       Examination:   BP 132/86 mmHg  Pulse 76  Temp(Src) 98.1 F (36.7 C)  Resp 14  Ht 6' (1.829 m)  Wt 182 lb 6.4 oz (82.736 kg)  BMI  24.73 kg/m2  SpO2 98%  Body mass index is 24.73 kg/(m^2).   ASSESSMENT/ PLAN:   Diabetes type 2   The patient's diabetes control appears to be excellent with adding Trulicity 0.75 mg Although fructosamine is moderately increased his home glucoses are recently near normal He has tolerated this well recently although initially had significant nausea Also  taking metformin 1500 mg a day, previously on 1000 mg a day Since he has had near normal blood sugars and improved blood pressure and weight he agrees to continue this He will try using only 750 mg of metformin and blood sugars go up he can increase the dose again Encouraged him to continue compliance with diet and exercise  HYPERLIPIDEMIA: LDL has been significantly high  With his diabetes and borderline blood pressure readings he is at relatively high risk and discussed benefits of statin drugs and reducing cardiovascular events He agrees to start pharmacological treatment and will give him 40 mg of pravastatin Needs follow-up in 3 months He will reconsider starting treatments if he is not at goal  Increased blood pressure: This had been improved previously and will continue to monitor.  He is still reluctant to take medications and wants to try lifestyle changes again  Patient Instructions  Please check blood sugars at least half the time about 2 hours after any meal and 3 times per week on waking up.  Please bring blood sugar monitor to each visit  Start Pravastin for lipids     Maverick Patman 02/25/2014, 2:10 PM

## 2014-03-08 ENCOUNTER — Encounter: Payer: Self-pay | Admitting: Endocrinology

## 2014-03-09 ENCOUNTER — Other Ambulatory Visit: Payer: Self-pay | Admitting: *Deleted

## 2014-03-09 MED ORDER — DULAGLUTIDE 0.75 MG/0.5ML ~~LOC~~ SOAJ: SUBCUTANEOUS | Status: DC

## 2014-03-09 MED ORDER — METFORMIN HCL ER 750 MG PO TB24: ORAL_TABLET | ORAL | Status: DC

## 2014-04-05 ENCOUNTER — Other Ambulatory Visit: Payer: Self-pay | Admitting: *Deleted

## 2014-04-05 MED ORDER — ACCU-CHEK NANO SMARTVIEW W/DEVICE KIT: PACK | Status: DC

## 2014-04-20 ENCOUNTER — Other Ambulatory Visit: Payer: Self-pay | Admitting: *Deleted

## 2014-04-20 MED ORDER — GLUCOSE BLOOD VI STRP: ORAL_STRIP | Status: DC

## 2014-05-24 ENCOUNTER — Other Ambulatory Visit (INDEPENDENT_AMBULATORY_CARE_PROVIDER_SITE_OTHER): Payer: 59

## 2014-05-24 DIAGNOSIS — E119 Type 2 diabetes mellitus without complications: Secondary | ICD-10-CM

## 2014-05-24 DIAGNOSIS — E7800 Pure hypercholesterolemia, unspecified: Secondary | ICD-10-CM

## 2014-05-24 LAB — COMPREHENSIVE METABOLIC PANEL
ALT: 18 U/L (ref 0–53)
AST: 18 U/L (ref 0–37)
Albumin: 4.2 g/dL (ref 3.5–5.2)
Alkaline Phosphatase: 48 U/L (ref 39–117)
BILIRUBIN TOTAL: 0.8 mg/dL (ref 0.2–1.2)
BUN: 22 mg/dL (ref 6–23)
CO2: 26 meq/L (ref 19–32)
Calcium: 9.6 mg/dL (ref 8.4–10.5)
Chloride: 103 mEq/L (ref 96–112)
Creatinine, Ser: 0.96 mg/dL (ref 0.40–1.50)
GFR: 82.55 mL/min (ref >60.00)
Glucose, Bld: 138 mg/dL — ABNORMAL HIGH (ref 70–99)
Potassium: 3.9 mEq/L (ref 3.5–5.1)
SODIUM: 138 meq/L (ref 135–145)
TOTAL PROTEIN: 6.7 g/dL (ref 6.0–8.3)

## 2014-05-24 LAB — HEMOGLOBIN A1C: HEMOGLOBIN A1C: 6.9 % — AB (ref 4.6–6.5)

## 2014-05-24 LAB — LIPID PANEL
Cholesterol: 175 mg/dL (ref 0–200)
HDL: 61.3 mg/dL (ref >39.00)
LDL CALC: 99 mg/dL (ref 0–99)
NonHDL: 113.7
Total CHOL/HDL Ratio: 3
Triglycerides: 75 mg/dL (ref 0.0–149.0)
VLDL: 15 mg/dL (ref 0.0–40.0)

## 2014-05-27 ENCOUNTER — Encounter: Payer: Self-pay | Admitting: Endocrinology

## 2014-05-27 ENCOUNTER — Ambulatory Visit (INDEPENDENT_AMBULATORY_CARE_PROVIDER_SITE_OTHER): Payer: 59 | Admitting: Endocrinology

## 2014-05-27 VITALS — BP 138/82 | HR 97 | Temp 98.3°F | Resp 14 | Ht 72.0 in | Wt 186.0 lb

## 2014-05-27 DIAGNOSIS — E7800 Pure hypercholesterolemia, unspecified: Secondary | ICD-10-CM

## 2014-05-27 DIAGNOSIS — E119 Type 2 diabetes mellitus without complications: Secondary | ICD-10-CM

## 2014-05-27 NOTE — Progress Notes (Signed)
Patient ID: Juan Morales, male   DOB: 11-Jun-1945, 69 y.o.   MRN: [id number]   Reason for Appointment: Diabetes follow-up   History of Present Illness   Diagnosis: Type 2 DIABETES MELITUS  He was initially diagnosed with prediabetes in 2003. Previously had relatively mild diabetes which was well-controlled with low-dose Jentadueto For convenience of once a day medication he was switched to Janumet XR 100/1000 once a day His A1c had been usually under 7%   When his A1c had gone up to 7.6% in 7/15 he was started on Trulicity  6.96 mg weekly.   With this his blood sugars have improved although not to target.  He is generally noncompliant with checking his blood sugars after meals and not bringing his meter for download; not clear how often he is checking his sugars Despite starting Trulicity continues to be gradually gaining weight This is likely to be from his not watching his diet with eating higher calorie foods especially sweets He does continue to be fairly active and walking in all types of whether also His A1c has improved further now On his last visit he had said that he will try to cut back on his sweets but has not done so He also continues to take 1500 mg of metformin without any side effects Again his very low reluctant to change his medication regimen despite his A1c starting to go up, now 6.9  Side effects from medications:  none  Monitors blood glucose: Once a day or less.    Glucometer: Accucheck        Blood Glucose readings: Fasting 120; pc 170-228 110-120, not checking postprandial  Hypoglycemia frequency: Never.          Meals: 3 meals per day.  Physical activity: exercise: Walking 25 miles/week or golf            Wt Readings from Last 3 Encounters:  05/27/14 186 lb (84.369 kg)  02/25/14 182 lb 6.4 oz (82.736 kg)  10/23/13 178 lb 12.8 oz (81.103 kg)   Lab Results  Component Value Date   HGBA1C 6.9* 05/24/2014   HGBA1C 6.7* 02/16/2014   HGBA1C 7.6* 09/23/2013    Lab Results  Component Value Date   MICROALBUR 1.9 02/16/2014   LDLCALC 99 05/24/2014   CREATININE 0.96 05/24/2014    Lab on 05/24/2014  Component Date Value Ref Range Status  . Hgb A1c MFr Bld 05/24/2014 6.9* 4.6 - 6.5 % Final   Glycemic Control Guidelines for People with Diabetes:Non Diabetic:  <6%Goal of Therapy: <7%Additional Action Suggested:  >8%   . Sodium 05/24/2014 138  135 - 145 mEq/L Final  . Potassium 05/24/2014 3.9  3.5 - 5.1 mEq/L Final  . Chloride 05/24/2014 103  96 - 112 mEq/L Final  . CO2 05/24/2014 26  19 - 32 mEq/L Final  . Glucose, Bld 05/24/2014 138* 70 - 99 mg/dL Final  . BUN 05/24/2014 22  6 - 23 mg/dL Final  . Creatinine, Ser 05/24/2014 0.96  0.40 - 1.50 mg/dL Final  . Total Bilirubin 05/24/2014 0.8  0.2 - 1.2 mg/dL Final  . Alkaline Phosphatase 05/24/2014 48  39 - 117 U/L Final  . AST 05/24/2014 18  0 - 37 U/L Final  . ALT 05/24/2014 18  0 - 53 U/L Final  . Total Protein 05/24/2014 6.7  6.0 - 8.3 g/dL Final  . Albumin 05/24/2014 4.2  3.5 - 5.2 g/dL Final  . Calcium 05/24/2014 9.6  8.4 - 10.5 mg/dL Final  .  GFR 05/24/2014 82.55  >60.00 mL/min Final  . Cholesterol 05/24/2014 175  0 - 200 mg/dL Final   ATP III Classification       Desirable:  < 200 mg/dL               Borderline High:  200 - 239 mg/dL          High:  > = 240 mg/dL  . Triglycerides 05/24/2014 75.0  0.0 - 149.0 mg/dL Final   Normal:  <150 mg/dLBorderline High:  150 - 199 mg/dL  . HDL 05/24/2014 61.30  >39.00 mg/dL Final  . VLDL 05/24/2014 15.0  0.0 - 40.0 mg/dL Final  . LDL Cholesterol 05/24/2014 99  0 - 99 mg/dL Final  . Total CHOL/HDL Ratio 05/24/2014 3   Final                  Men          Women1/2 Average Risk     3.4          3.3Average Risk          5.0          4.42X Average Risk          9.6          7.13X Average Risk          15.0          11.0                      . NonHDL 05/24/2014 113.70   Final   NOTE:  Non-HDL goal should be 30 mg/dL higher than patient's LDL goal (i.e. LDL  goal of < 70 mg/dL, would have non-HDL goal of < 100 mg/dL)      Medication List       This list is accurate as of: 05/27/14 11:59 PM.  Always use your most recent med list.               ACCU-CHEK NANO SMARTVIEW W/DEVICE Kit  Use to check blood sugar once a day dx code E11.65     Dulaglutide 0.75 MG/0.5ML Sopn  Commonly known as:  TRULICITY  Inject once a week     glucose blood test strip  Commonly known as:  ACCU-CHEK SMARTVIEW  Use as instructed to check blood sugar once a day dx code E11.65     metFORMIN 750 MG 24 hr tablet  Commonly known as:  GLUCOPHAGE XR  Take 2 tablets daily     pravastatin 40 MG tablet  Commonly known as:  PRAVACHOL  Take 1 tablet (40 mg total) by mouth daily.     sildenafil 100 MG tablet  Commonly known as:  VIAGRA  Take 1 tablet (100 mg total) by mouth daily as needed for erectile dysfunction.        Allergies: No Known Allergies  Past Medical History  Diagnosis Date  . Macular degeneration     History reviewed. No pertinent past surgical history.  Family History  Problem Relation Age of Onset  . Diabetes Neg Hx   . Heart disease Neg Hx   . Stroke Neg Hx   . Hypertension Father     Social History:  reports that he has never smoked. He does not have any smokeless tobacco history on file. His alcohol and drug histories are not on file.  Review of Systems:  Has macular degeneration, last exam 9/15  No history of  hypertension  but his blood pressure has been high normal previously He has not monitored his blood pressure   HYPERLIPIDEMIA: The lipid abnormality consists of elevated LDL. He has finally agreed to take pravastatin and his LDL is below 100 now He can still do better on his diet   Lab Results  Component Value Date   CHOL 175 05/24/2014   HDL 61.30 05/24/2014   LDLCALC 99 05/24/2014   LDLDIRECT 144.3 02/16/2014   TRIG 75.0 05/24/2014   CHOLHDL 3 05/24/2014    He has had long-standing erectile dysfunction,  previously has been using Viagra with benefit.       Examination:   BP 138/82 mmHg  Pulse 97  Temp(Src) 98.3 F (36.8 C)  Resp 14  Ht 6' (1.829 m)  Wt 186 lb (84.369 kg)  BMI 25.22 kg/m2  SpO2 80%  Body mass index is 25.22 kg/(m^2).   ASSESSMENT/ PLAN:   Diabetes type 2   The patient's diabetes control appears to be somewhat worse even with continuing his Trulicity which had initially helped him. His weight is gradually going up Slightly has significant postprandial hyperglycemia which he is not monitoring  Also taking metformin 1500 mg a day He was recommended that we increase the Trulicity to 1.5 mg to help with postprandial control, portion control and reduced need for snacking with sweets. He refuses to do this and wants to wait another 6 months with trying to do better on his diet on his own Reminded him to check readings after meals also  HYPERLIPIDEMIA: LDL has been significantly high and he is doing well with starting pravastatin 40 mg with LDL 99. He may improve further with improving his diet   Juan Morales 05/28/2014, 12:34 PM

## 2014-06-29 ENCOUNTER — Encounter: Payer: Self-pay | Admitting: Endocrinology

## 2014-08-09 DIAGNOSIS — H35372 Puckering of macula, left eye: Secondary | ICD-10-CM | POA: Diagnosis not present

## 2014-08-09 DIAGNOSIS — H2513 Age-related nuclear cataract, bilateral: Secondary | ICD-10-CM | POA: Diagnosis not present

## 2014-08-09 DIAGNOSIS — H43813 Vitreous degeneration, bilateral: Secondary | ICD-10-CM | POA: Diagnosis not present

## 2014-08-09 DIAGNOSIS — E119 Type 2 diabetes mellitus without complications: Secondary | ICD-10-CM | POA: Diagnosis not present

## 2014-08-25 ENCOUNTER — Other Ambulatory Visit: Payer: Self-pay | Admitting: *Deleted

## 2014-08-25 ENCOUNTER — Encounter: Payer: Self-pay | Admitting: Endocrinology

## 2014-08-25 MED ORDER — METFORMIN HCL ER 750 MG PO TB24: ORAL_TABLET | ORAL | Status: DC

## 2014-09-24 DIAGNOSIS — H2512 Age-related nuclear cataract, left eye: Secondary | ICD-10-CM | POA: Diagnosis not present

## 2014-09-24 DIAGNOSIS — H2511 Age-related nuclear cataract, right eye: Secondary | ICD-10-CM | POA: Diagnosis not present

## 2014-09-24 DIAGNOSIS — H25011 Cortical age-related cataract, right eye: Secondary | ICD-10-CM | POA: Diagnosis not present

## 2014-09-24 DIAGNOSIS — H25012 Cortical age-related cataract, left eye: Secondary | ICD-10-CM | POA: Diagnosis not present

## 2014-09-24 DIAGNOSIS — H25042 Posterior subcapsular polar age-related cataract, left eye: Secondary | ICD-10-CM | POA: Diagnosis not present

## 2014-10-01 DIAGNOSIS — M25562 Pain in left knee: Secondary | ICD-10-CM | POA: Diagnosis not present

## 2014-10-01 DIAGNOSIS — M238X2 Other internal derangements of left knee: Secondary | ICD-10-CM | POA: Diagnosis not present

## 2014-10-07 DIAGNOSIS — H25812 Combined forms of age-related cataract, left eye: Secondary | ICD-10-CM | POA: Diagnosis not present

## 2014-10-07 DIAGNOSIS — H2512 Age-related nuclear cataract, left eye: Secondary | ICD-10-CM | POA: Diagnosis not present

## 2014-10-08 DIAGNOSIS — M25562 Pain in left knee: Secondary | ICD-10-CM | POA: Diagnosis not present

## 2014-10-08 DIAGNOSIS — H2511 Age-related nuclear cataract, right eye: Secondary | ICD-10-CM | POA: Diagnosis not present

## 2014-10-25 DIAGNOSIS — M67462 Ganglion, left knee: Secondary | ICD-10-CM | POA: Diagnosis not present

## 2014-10-25 DIAGNOSIS — M1712 Unilateral primary osteoarthritis, left knee: Secondary | ICD-10-CM | POA: Diagnosis not present

## 2014-10-25 DIAGNOSIS — S83242A Other tear of medial meniscus, current injury, left knee, initial encounter: Secondary | ICD-10-CM | POA: Diagnosis not present

## 2014-10-25 DIAGNOSIS — M7122 Synovial cyst of popliteal space [Baker], left knee: Secondary | ICD-10-CM | POA: Diagnosis not present

## 2014-10-28 DIAGNOSIS — H25811 Combined forms of age-related cataract, right eye: Secondary | ICD-10-CM | POA: Diagnosis not present

## 2014-10-28 DIAGNOSIS — H2511 Age-related nuclear cataract, right eye: Secondary | ICD-10-CM | POA: Diagnosis not present

## 2014-11-24 ENCOUNTER — Other Ambulatory Visit (INDEPENDENT_AMBULATORY_CARE_PROVIDER_SITE_OTHER): Payer: 59

## 2014-11-24 DIAGNOSIS — E119 Type 2 diabetes mellitus without complications: Secondary | ICD-10-CM

## 2014-11-24 LAB — HEMOGLOBIN A1C: Hgb A1c MFr Bld: 8.2 % — ABNORMAL HIGH (ref 4.6–6.5)

## 2014-11-24 LAB — URINALYSIS, ROUTINE W REFLEX MICROSCOPIC
Bilirubin Urine: NEGATIVE
Hgb urine dipstick: NEGATIVE
KETONES UR: NEGATIVE
Leukocytes, UA: NEGATIVE
NITRITE: NEGATIVE
Urine Glucose: NEGATIVE
Urobilinogen, UA: 0.2 (ref 0.0–1.0)
pH: 5.5 (ref 5.0–8.0)

## 2014-11-24 LAB — COMPREHENSIVE METABOLIC PANEL
ALT: 17 U/L (ref 0–53)
AST: 15 U/L (ref 0–37)
Albumin: 4.2 g/dL (ref 3.5–5.2)
Alkaline Phosphatase: 51 U/L (ref 39–117)
BUN: 24 mg/dL — ABNORMAL HIGH (ref 6–23)
CHLORIDE: 105 meq/L (ref 96–112)
CO2: 30 meq/L (ref 19–32)
CREATININE: 1.19 mg/dL (ref 0.40–1.50)
Calcium: 9.8 mg/dL (ref 8.4–10.5)
GFR: 64.33 mL/min (ref >60.00)
Glucose, Bld: 171 mg/dL — ABNORMAL HIGH (ref 70–99)
POTASSIUM: 4.1 meq/L (ref 3.5–5.1)
Sodium: 142 mEq/L (ref 135–145)
Total Bilirubin: 0.8 mg/dL (ref 0.2–1.2)
Total Protein: 6.7 g/dL (ref 6.0–8.3)

## 2014-11-26 ENCOUNTER — Ambulatory Visit (INDEPENDENT_AMBULATORY_CARE_PROVIDER_SITE_OTHER): Payer: 59 | Admitting: Endocrinology

## 2014-11-26 ENCOUNTER — Encounter: Payer: Self-pay | Admitting: Endocrinology

## 2014-11-26 VITALS — BP 136/92 | HR 81 | Temp 98.6°F | Resp 16 | Ht 72.0 in | Wt 183.6 lb

## 2014-11-26 DIAGNOSIS — E1165 Type 2 diabetes mellitus with hyperglycemia: Secondary | ICD-10-CM | POA: Diagnosis not present

## 2014-11-26 DIAGNOSIS — I10 Essential (primary) hypertension: Secondary | ICD-10-CM | POA: Diagnosis not present

## 2014-11-26 DIAGNOSIS — Z23 Encounter for immunization: Secondary | ICD-10-CM | POA: Diagnosis not present

## 2014-11-26 DIAGNOSIS — IMO0002 Reserved for concepts with insufficient information to code with codable children: Secondary | ICD-10-CM

## 2014-11-26 MED ORDER — RAMIPRIL 2.5 MG PO CAPS: 2.5000 mg | ORAL_CAPSULE | Freq: Every day | ORAL | Status: DC

## 2014-11-26 NOTE — Patient Instructions (Signed)
Check blood sugars on waking up .Marland Kitchen 2-3 .Marland Kitchen times a week Also check blood sugars about 2 hours after a meal and do this after different meals by rotation Recommended blood sugar levels on waking up is 90-130 and about 2 hours after meal is 140-180 Please bring blood sugar monitor to each visit.  Restart trulicity

## 2014-11-26 NOTE — Progress Notes (Signed)
Patient ID: Juan Morales, male   DOB: 1945/10/02, 70 y.o.   MRN: [id number]   Reason for Appointment: Diabetes follow-up   History of Present Illness   Diagnosis: Type 2 DIABETES MELITUS  He was initially diagnosed with prediabetes in 2003. Previously had relatively mild diabetes which was well-controlled with low-dose Jentadueto For convenience of once a day medication he was switched to Janumet XR 100/1000 once a day His A1c had been usually under 7%   When his A1c had gone up to 7.6% in 7/15 he was started on Trulicity  8.47 mg weekly.   With this his blood sugars improved although not to target.   On his last visit he was recommended 1.5 mg of Trulicity for more optimal glycemic control and prevent further weight gain but he wanted to work on his diet first. He also continues to take 1500 mg of metformin without any side effects  Current blood sugar patterns and problems identified:  He was asked in April to check with his insurance about alternatives to Trulicity as he was concerned about the cost.  However he did not call back or check with his insurance about this.  Most likely has not taken any Trulicity since then and did not call back about his sugars  His sugars have been consistently high since he has been off Trulicity  He is again checking blood sugars primarily in the morning and does not bring his monitor for review despite reminders  Her lab glucose was 171  Despite not taking Trulicity has lost a little weight, does not think he has an increased appetite  His glucose reading may behigher at times after eating if he goes off his diet  He does continue to be fairly active with walking  Side effects from medications:  none  Monitors blood glucose: Once a day or less.    Glucometer: Accucheck        Blood Glucose readings: 180 in ams, over 200 at times if checking after meals   Hypoglycemia frequency: Never.          Meals: 3 meals per day.  Physical activity:  exercise: Walking 25 miles/week or golf            Wt Readings from Last 3 Encounters:  11/26/14 183 lb 9.6 oz (83.28 kg)  05/27/14 186 lb (84.369 kg)  02/25/14 182 lb 6.4 oz (82.736 kg)   Lab Results  Component Value Date   HGBA1C 8.2* 11/24/2014   HGBA1C 6.9* 05/24/2014   HGBA1C 6.7* 02/16/2014   Lab Results  Component Value Date   MICROALBUR 1.9 02/16/2014   [place] 99 05/24/2014   CREATININE 1.19 11/24/2014    Appointment on 11/24/2014  Component Date Value Ref Range Status  . Hgb A1c MFr Bld 11/24/2014 8.2* 4.6 - 6.5 % Final   Glycemic Control Guidelines for People with Diabetes:Non Diabetic:  <6%Goal of Therapy: <7%Additional Action Suggested:  >8%   . Sodium 11/24/2014 142  135 - 145 mEq/L Final  . Potassium 11/24/2014 4.1  3.5 - 5.1 mEq/L Final  . Chloride 11/24/2014 105  96 - 112 mEq/L Final  . CO2 11/24/2014 30  19 - 32 mEq/L Final  . Glucose, Bld 11/24/2014 171* 70 - 99 mg/dL Final  . BUN 11/24/2014 24* 6 - 23 mg/dL Final  . Creatinine, Ser 11/24/2014 1.19  0.40 - 1.50 mg/dL Final  . Total Bilirubin 11/24/2014 0.8  0.2 - 1.2 mg/dL Final  . Alkaline Phosphatase 11/24/2014  51  39 - 117 U/L Final  . AST 11/24/2014 15  0 - 37 U/L Final  . ALT 11/24/2014 17  0 - 53 U/L Final  . Total Protein 11/24/2014 6.7  6.0 - 8.3 g/dL Final  . Albumin 11/24/2014 4.2  3.5 - 5.2 g/dL Final  . Calcium 11/24/2014 9.8  8.4 - 10.5 mg/dL Final  . GFR 11/24/2014 64.33  >60.00 mL/min Final  . Color, Urine 11/24/2014 YELLOW  Yellow;Lt. Yellow Final  . APPearance 11/24/2014 CLOUDY* Clear Final  . Specific Gravity, Urine 11/24/2014 >=1.030* 1.000 - 1.030 Final  . pH 11/24/2014 5.5  5.0 - 8.0 Final  . Total Protein, Urine 11/24/2014 TRACE* Negative Final  . Urine Glucose 11/24/2014 NEGATIVE  Negative Final  . Ketones, ur 11/24/2014 NEGATIVE  Negative Final  . Bilirubin Urine 11/24/2014 NEGATIVE  Negative Final  . Hgb urine dipstick 11/24/2014 NEGATIVE  Negative Final  . Urobilinogen, UA  11/24/2014 0.2  0.0 - 1.0 Final  . Leukocytes, UA 11/24/2014 NEGATIVE  Negative Final  . Nitrite 11/24/2014 NEGATIVE  Negative Final  . WBC, UA 11/24/2014 0-2/hpf  0-2/hpf Final  . RBC / HPF 11/24/2014 0-2/hpf  0-2/hpf Final  . Squamous Epithelial / LPF 11/24/2014 Rare(0-4/hpf)  Rare(0-4/hpf) Final  . Granular Casts, UA 11/24/2014 Presence of* None Final  . Hyaline Casts, UA 11/24/2014 Presence of* None Final      Medication List       This list is accurate as of: 11/26/14  4:46 PM.  Always use your most recent med list.               ACCU-CHEK NANO SMARTVIEW W/DEVICE Kit  Use to check blood sugar once a day dx code E11.65     Dulaglutide 0.75 MG/0.5ML Sopn  Commonly known as:  TRULICITY  Inject once a week     glucose blood test strip  Commonly known as:  ACCU-CHEK SMARTVIEW  Use as instructed to check blood sugar once a day dx code E11.65     metFORMIN 750 MG 24 hr tablet  Commonly known as:  GLUCOPHAGE XR  Take 2 tablets daily     pravastatin 40 MG tablet  Commonly known as:  PRAVACHOL  Take 1 tablet (40 mg total) by mouth daily.     ramipril 2.5 MG capsule  Commonly known as:  ALTACE  Take 1 capsule (2.5 mg total) by mouth daily.     sildenafil 100 MG tablet  Commonly known as:  VIAGRA  Take 1 tablet (100 mg total) by mouth daily as needed for erectile dysfunction.        Allergies: No Known Allergies  Past Medical History  Diagnosis Date  . Macular degeneration     No past surgical history on file.  Family History  Problem Relation Age of Onset  . Diabetes Neg Hx   . Heart disease Neg Hx   . Stroke Neg Hx   . Hypertension Father     Social History:  reports that he has never smoked. He does not have any smokeless tobacco history on file. His alcohol and drug histories are not on file.  Review of Systems:  Has macular degeneration, last exam 3/16  No history of hypertension  but his blood pressure has been high normal previously and appears  higher now He has not monitored his blood pressureat home   HYPERLIPIDEMIA: The lipid abnormality consists of elevated LDL. He has continued to take pravastatin and his LDL is below  100    Lab Results  Component Value Date   CHOL 175 05/24/2014   HDL 61.30 05/24/2014   LDLCALC 99 05/24/2014   LDLDIRECT 144.3 02/16/2014   TRIG 75.0 05/24/2014   CHOLHDL 3 05/24/2014    He has had long-standing erectile dysfunction,  has been prescribed Viagra with benefit.       Examination:   BP 136/92 mmHg  Pulse 81  Temp(Src) 98.6 F (37 C)  Resp 16  Ht 6' (1.829 m)  Wt 183 lb 9.6 oz (83.28 kg)  BMI 24.90 kg/m2  SpO2 97%  Body mass index is 24.9 kg/(m^2).   ASSESSMENT/ PLAN:   Diabetes type 2   The patient's diabetes control appears to be significantly worse with his not taking Trulicity He did not let us know about his insurance preference in place of Trulicity but appears that his high cost is related to being in the donut hole  Currently he wants to continue Trulicity as other medications are not less expensive Since he already has a supply of Trulicity that just came in he can start taking the 0.5 mg weekly  Also continue taking metformin 1500 mg a day Will reassess his A1c in 3 months  HYPERTENSION: His blood pressure is significantly high and explained the need for treatment for multiple benefits with ACE inhibitors and possible side effects  Discussed need for preventive care with Pneumovax and influenza vaccine, he agrees to take Prevnar today   [place] 11/26/2014, 4:46 PM

## 2014-11-30 DIAGNOSIS — M94262 Chondromalacia, left knee: Secondary | ICD-10-CM | POA: Diagnosis not present

## 2014-11-30 DIAGNOSIS — X58XXXA Exposure to other specified factors, initial encounter: Secondary | ICD-10-CM | POA: Diagnosis not present

## 2014-11-30 DIAGNOSIS — M11262 Other chondrocalcinosis, left knee: Secondary | ICD-10-CM | POA: Diagnosis not present

## 2014-11-30 DIAGNOSIS — S83282A Other tear of lateral meniscus, current injury, left knee, initial encounter: Secondary | ICD-10-CM | POA: Diagnosis not present

## 2014-11-30 DIAGNOSIS — G8918 Other acute postprocedural pain: Secondary | ICD-10-CM | POA: Diagnosis not present

## 2014-11-30 DIAGNOSIS — S83232A Complex tear of medial meniscus, current injury, left knee, initial encounter: Secondary | ICD-10-CM | POA: Diagnosis not present

## 2014-11-30 DIAGNOSIS — M23322 Other meniscus derangements, posterior horn of medial meniscus, left knee: Secondary | ICD-10-CM | POA: Diagnosis not present

## 2014-11-30 DIAGNOSIS — Y929 Unspecified place or not applicable: Secondary | ICD-10-CM | POA: Diagnosis not present

## 2014-11-30 DIAGNOSIS — M23352 Other meniscus derangements, posterior horn of lateral meniscus, left knee: Secondary | ICD-10-CM | POA: Diagnosis not present

## 2014-12-08 DIAGNOSIS — M1712 Unilateral primary osteoarthritis, left knee: Secondary | ICD-10-CM | POA: Diagnosis not present

## 2014-12-09 ENCOUNTER — Encounter: Payer: Self-pay | Admitting: Endocrinology

## 2014-12-15 DIAGNOSIS — M1712 Unilateral primary osteoarthritis, left knee: Secondary | ICD-10-CM | POA: Diagnosis not present

## 2015-01-21 ENCOUNTER — Encounter: Payer: Self-pay | Admitting: Endocrinology

## 2015-01-21 ENCOUNTER — Other Ambulatory Visit: Payer: Self-pay | Admitting: *Deleted

## 2015-01-21 MED ORDER — PRAVASTATIN SODIUM 40 MG PO TABS: 40.0000 mg | ORAL_TABLET | Freq: Every day | ORAL | Status: DC

## 2015-01-21 MED ORDER — DULAGLUTIDE 0.75 MG/0.5ML ~~LOC~~ SOAJ: SUBCUTANEOUS | Status: DC

## 2015-01-21 MED ORDER — GLUCOSE BLOOD VI STRP: ORAL_STRIP | Status: DC

## 2015-01-21 MED ORDER — METFORMIN HCL ER 750 MG PO TB24: ORAL_TABLET | ORAL | Status: DC

## 2015-02-22 ENCOUNTER — Other Ambulatory Visit (INDEPENDENT_AMBULATORY_CARE_PROVIDER_SITE_OTHER): Payer: 59

## 2015-02-22 DIAGNOSIS — E1165 Type 2 diabetes mellitus with hyperglycemia: Secondary | ICD-10-CM | POA: Diagnosis not present

## 2015-02-22 DIAGNOSIS — IMO0002 Reserved for concepts with insufficient information to code with codable children: Secondary | ICD-10-CM

## 2015-02-22 LAB — COMPREHENSIVE METABOLIC PANEL
ALBUMIN: 3.9 g/dL (ref 3.5–5.2)
ALT: 16 U/L (ref 0–53)
AST: 15 U/L (ref 0–37)
Alkaline Phosphatase: 46 U/L (ref 39–117)
BUN: 16 mg/dL (ref 6–23)
CHLORIDE: 106 meq/L (ref 96–112)
CO2: 30 meq/L (ref 19–32)
CREATININE: 0.91 mg/dL (ref 0.40–1.50)
Calcium: 9.7 mg/dL (ref 8.4–10.5)
GFR: 87.61 mL/min (ref >60.00)
Glucose, Bld: 140 mg/dL — ABNORMAL HIGH (ref 70–99)
Potassium: 4.9 mEq/L (ref 3.5–5.1)
SODIUM: 143 meq/L (ref 135–145)
Total Bilirubin: 0.5 mg/dL (ref 0.2–1.2)
Total Protein: 6.3 g/dL (ref 6.0–8.3)

## 2015-02-22 LAB — MICROALBUMIN / CREATININE URINE RATIO
Creatinine,U: 105.6 mg/dL
Microalb Creat Ratio: 0.7 mg/g (ref 0.0–30.0)
Microalb, Ur: <0.7 mg/dL (ref 0.0–1.9)

## 2015-02-22 LAB — HEMOGLOBIN A1C: Hgb A1c MFr Bld: 6.6 % — ABNORMAL HIGH (ref 4.6–6.5)

## 2015-02-25 ENCOUNTER — Ambulatory Visit (INDEPENDENT_AMBULATORY_CARE_PROVIDER_SITE_OTHER): Payer: 59 | Admitting: Endocrinology

## 2015-02-25 ENCOUNTER — Encounter: Payer: Self-pay | Admitting: Endocrinology

## 2015-02-25 VITALS — BP 130/80 | HR 85 | Temp 98.5°F | Resp 14 | Ht 72.0 in | Wt 175.2 lb

## 2015-02-25 DIAGNOSIS — E119 Type 2 diabetes mellitus without complications: Secondary | ICD-10-CM

## 2015-02-25 DIAGNOSIS — E7800 Pure hypercholesterolemia, unspecified: Secondary | ICD-10-CM | POA: Diagnosis not present

## 2015-02-25 NOTE — Patient Instructions (Signed)
Omrom BP meter  Check blood sugars on waking up  2  times a week Also check blood sugars about 2 hours after a meal and do this after different meals by rotation  Recommended blood sugar levels on waking up is 90-130 and about 2 hours after meal is 130-160  Please bring your blood sugar monitor to each visit, thank you

## 2015-02-25 NOTE — Progress Notes (Signed)
Patient ID: Juan Morales, male   DOB: August 28, 1945, 69 y.o.   MRN: [id number]   Reason for Appointment: Diabetes follow-up   History of Present Illness   Diagnosis: Type 2 DIABETES MELITUS  He was initially diagnosed with prediabetes in 2003. Previously had relatively mild diabetes which was previously well-controlled with low-dose Jentadueto or Janumet XR His A1c had been usually under 7%   When his A1c had gone up to 7.6% in 7/15 he was started on Trulicity  9.98 mg weekly.   With this his blood sugars improved somewhat, he was also recommended 1.5 mg dosage but he wanted to work on his diet first  He also continues to take 1500 mg of metformin without any side effects  Current blood sugar patterns and problems identified:  In 9/16 he had gone off his Trulicity because of cost  Since his last visit he has been able to lose weight and improve his blood sugar control with restarting Trulicity  He is taking this regularly now   He is again checking blood sugars primarily in the morning and  these are mildly increased  Has only occasional readings after meals, mostly in the afternoon and evening are not usually high  Her lab glucose was 140 fasting  His glucose readingis probably high after evening meal since he goes off his diet at times and eats desserts regularly  He does continue to be fairly active with walking but less since his knees have been hurting more   Side effects from medications:  none  Monitors blood glucose: Once a day or less.    Glucometer: Accucheck        Blood Glucose readings: FASTING range 111-135, afternoon 101-179 and evening 80, 139 Overall average 123   Hypoglycemia frequency: Never.          Meals: 3 meals per day.  Physical activity: exercise: Walking  10-12  miles/week or golf            Wt Readings from Last 3 Encounters:  02/25/15 175 lb 3.2 oz (79.47 kg)  11/26/14 183 lb 9.6 oz (83.28 kg)  05/27/14 186 lb (84.369 kg)   Lab  Results  Component Value Date   HGBA1C 6.6* 02/22/2015   HGBA1C 8.2* 11/24/2014   HGBA1C 6.9* 05/24/2014   Lab Results  Component Value Date   MICROALBUR <0.7 02/22/2015   LDLCALC 99 05/24/2014   CREATININE 0.91 02/22/2015    Lab on 02/22/2015  Component Date Value Ref Range Status  . Hgb A1c MFr Bld 02/22/2015 6.6* 4.6 - 6.5 % Final   Glycemic Control Guidelines for People with Diabetes:Non Diabetic:  <6%Goal of Therapy: <7%Additional Action Suggested:  >8%   . Sodium 02/22/2015 143  135 - 145 mEq/L Final  . Potassium 02/22/2015 4.9  3.5 - 5.1 mEq/L Final  . Chloride 02/22/2015 106  96 - 112 mEq/L Final  . CO2 02/22/2015 30  19 - 32 mEq/L Final  . Glucose, Bld 02/22/2015 140* 70 - 99 mg/dL Final  . BUN 02/22/2015 16  6 - 23 mg/dL Final  . Creatinine, Ser 02/22/2015 0.91  0.40 - 1.50 mg/dL Final  . Total Bilirubin 02/22/2015 0.5  0.2 - 1.2 mg/dL Final  . Alkaline Phosphatase 02/22/2015 46  39 - 117 U/L Final  . AST 02/22/2015 15  0 - 37 U/L Final  . ALT 02/22/2015 16  0 - 53 U/L Final  . Total Protein 02/22/2015 6.3  6.0 - 8.3 g/dL  Final  . Albumin 02/22/2015 3.9  3.5 - 5.2 g/dL Final  . Calcium 02/22/2015 9.7  8.4 - 10.5 mg/dL Final  . GFR 02/22/2015 87.61  >60.00 mL/min Final  . Microalb, Ur 02/22/2015 <0.7  0.0 - 1.9 mg/dL Final  . Creatinine,U 02/22/2015 105.6   Final  . Microalb Creat Ratio 02/22/2015 0.7  0.0 - 30.0 mg/g Final      Medication List       This list is accurate as of: 02/25/15 11:59 PM.  Always use your most recent med list.               ACCU-CHEK NANO SMARTVIEW W/DEVICE Kit  Use to check blood sugar once a day dx code E11.65     Dulaglutide 0.75 MG/0.5ML Sopn  Commonly known as:  TRULICITY  Inject once a week     glucose blood test strip  Commonly known as:  ACCU-CHEK SMARTVIEW  Use as instructed to check blood sugar once a day dx code E11.65     metFORMIN 750 MG 24 hr tablet  Commonly known as:  GLUCOPHAGE XR  Take 2 tablets daily       pravastatin 40 MG tablet  Commonly known as:  PRAVACHOL  Take 1 tablet (40 mg total) by mouth daily.     sildenafil 100 MG tablet  Commonly known as:  VIAGRA  Take 1 tablet (100 mg total) by mouth daily as needed for erectile dysfunction.        Allergies: No Known Allergies  Past Medical History  Diagnosis Date  . Macular degeneration     No past surgical history on file.  Family History  Problem Relation Age of Onset  . Diabetes Neg Hx   . Heart disease Neg Hx   . Stroke Neg Hx   . Hypertension Father     Social History:  reports that he has never smoked. He does not have any smokeless tobacco history on file. His alcohol and drug histories are not on file.  Review of Systems:  Has macular degeneration, last exam 3/16  No history of hypertension  but his blood pressure has been high normal previously He has been recommended starting ramipril but with this his blood pressure was relatively low at home when he was lightheaded He has  monitored his blood pressure periodically at home  but his home monitor gives different readings every time he checks it even on the same day with recent diastolic ranged 38-45.  Using inexpensive meter from drugstore    HYPERLIPIDEMIA: The lipid abnormality consists of elevated LDL. He has continued to take pravastatin and his LDL is below 100    Lab Results  Component Value Date   CHOL 175 05/24/2014   HDL 61.30 05/24/2014   LDLCALC 99 05/24/2014   LDLDIRECT 144.3 02/16/2014   TRIG 75.0 05/24/2014   CHOLHDL 3 05/24/2014    He has had long-standing erectile dysfunction,  has been prescribed Viagra with good results      Examination:   BP 130/80 mmHg  Pulse 85  Temp(Src) 98.5 F (36.9 C)  Resp 14  Ht 6' (1.829 m)  Wt 175 lb 3.2 oz (79.47 kg)  BMI 23.76 kg/m2  SpO2 97%  Body mass index is 23.76 kg/(m^2).   ASSESSMENT/ PLAN:   Diabetes type 2   The patient's diabetes control appears to be significantly better with  restarting Trulicity He has also been able to lose weight even though he is not exercising  as much A1c is now reasonably good at 6.6 He wants to continue this regimen even though it is costing him significantly out of pocke  Also continue taking metformin 1500 mg a day Advised to increase monitoring after meal and less in the morning Will reassess his A1c in 4 months  HYPERTENSION: His blood pressure is  not as high with his weight loss His home monitoring appears to be erratic but gives him an overall picture Advised an  hormone Om[employer] monitor    Discussed need for preventive care with   influenza vaccine, he refuses   Juan Morales 02/27/2015, 11:03 AM

## 2015-03-10 DIAGNOSIS — M1712 Unilateral primary osteoarthritis, left knee: Secondary | ICD-10-CM | POA: Diagnosis not present

## 2015-03-10 DIAGNOSIS — M25562 Pain in left knee: Secondary | ICD-10-CM | POA: Diagnosis not present

## 2015-04-07 DIAGNOSIS — M1712 Unilateral primary osteoarthritis, left knee: Secondary | ICD-10-CM | POA: Diagnosis not present

## 2015-04-07 DIAGNOSIS — M25562 Pain in left knee: Secondary | ICD-10-CM | POA: Diagnosis not present

## 2015-04-25 DIAGNOSIS — M1712 Unilateral primary osteoarthritis, left knee: Secondary | ICD-10-CM | POA: Diagnosis not present

## 2015-04-25 DIAGNOSIS — M25562 Pain in left knee: Secondary | ICD-10-CM | POA: Diagnosis not present

## 2015-06-06 DIAGNOSIS — M25562 Pain in left knee: Secondary | ICD-10-CM | POA: Diagnosis not present

## 2015-06-06 DIAGNOSIS — M1712 Unilateral primary osteoarthritis, left knee: Secondary | ICD-10-CM | POA: Diagnosis not present

## 2015-06-28 ENCOUNTER — Other Ambulatory Visit: Payer: 59

## 2015-06-30 ENCOUNTER — Encounter: Payer: Self-pay | Admitting: Endocrinology

## 2015-07-01 ENCOUNTER — Ambulatory Visit: Payer: 59 | Admitting: Endocrinology

## 2015-07-01 ENCOUNTER — Other Ambulatory Visit: Payer: Self-pay | Admitting: *Deleted

## 2015-07-01 MED ORDER — PRAVASTATIN SODIUM 40 MG PO TABS: 40.0000 mg | ORAL_TABLET | Freq: Every day | ORAL | Status: DC

## 2015-07-01 MED ORDER — METFORMIN HCL ER 750 MG PO TB24: ORAL_TABLET | ORAL | Status: DC

## 2015-07-04 ENCOUNTER — Other Ambulatory Visit (INDEPENDENT_AMBULATORY_CARE_PROVIDER_SITE_OTHER): Payer: 59

## 2015-07-04 DIAGNOSIS — E119 Type 2 diabetes mellitus without complications: Secondary | ICD-10-CM

## 2015-07-04 LAB — LIPID PANEL
CHOL/HDL RATIO: 3
Cholesterol: 170 mg/dL (ref 0–200)
HDL: 54.8 mg/dL (ref >39.00)
LDL CALC: 100 mg/dL — AB (ref 0–99)
NonHDL: 115.55
TRIGLYCERIDES: 80 mg/dL (ref 0.0–149.0)
VLDL: 16 mg/dL (ref 0.0–40.0)

## 2015-07-04 LAB — COMPREHENSIVE METABOLIC PANEL
ALBUMIN: 4.2 g/dL (ref 3.5–5.2)
ALT: 17 U/L (ref 0–53)
AST: 16 U/L (ref 0–37)
Alkaline Phosphatase: 48 U/L (ref 39–117)
BUN: 25 mg/dL — ABNORMAL HIGH (ref 6–23)
CALCIUM: 10.3 mg/dL (ref 8.4–10.5)
CHLORIDE: 104 meq/L (ref 96–112)
CO2: 32 meq/L (ref 19–32)
CREATININE: 1.12 mg/dL (ref 0.40–1.50)
GFR: 68.87 mL/min (ref >60.00)
Glucose, Bld: 163 mg/dL — ABNORMAL HIGH (ref 70–99)
Potassium: 4.6 mEq/L (ref 3.5–5.1)
Sodium: 142 mEq/L (ref 135–145)
TOTAL PROTEIN: 6.6 g/dL (ref 6.0–8.3)
Total Bilirubin: 0.7 mg/dL (ref 0.2–1.2)

## 2015-07-04 LAB — HEMOGLOBIN A1C: Hgb A1c MFr Bld: 6.9 % — ABNORMAL HIGH (ref 4.6–6.5)

## 2015-07-05 ENCOUNTER — Other Ambulatory Visit: Payer: 59

## 2015-07-07 ENCOUNTER — Ambulatory Visit (INDEPENDENT_AMBULATORY_CARE_PROVIDER_SITE_OTHER): Payer: 59 | Admitting: Endocrinology

## 2015-07-07 ENCOUNTER — Encounter: Payer: Self-pay | Admitting: Endocrinology

## 2015-07-07 VITALS — BP 134/86 | HR 87 | Temp 97.8°F | Resp 14 | Ht 72.0 in | Wt 185.2 lb

## 2015-07-07 DIAGNOSIS — E1165 Type 2 diabetes mellitus with hyperglycemia: Secondary | ICD-10-CM | POA: Diagnosis not present

## 2015-07-07 MED ORDER — SILDENAFIL CITRATE 100 MG PO TABS: 100.0000 mg | ORAL_TABLET | Freq: Every day | ORAL | Status: DC | PRN

## 2015-07-07 NOTE — Progress Notes (Signed)
Patient ID: Juan Morales, male   DOB: Mar 18, 1946, 70 y.o.   MRN: [id number]   Reason for Appointment: Diabetes follow-up   History of Present Illness   Diagnosis: Type 2 DIABETES MELITUS  He was initially diagnosed with prediabetes in 2003. Previously had relatively mild diabetes which was previously well-controlled with low-dose Jentadueto or Janumet XR His A1c had been usually under 7%   When his A1c had gone up to 7.6% in 7/15 he was started on Trulicity  1.88 mg weekly.   With this his blood sugars improved somewhat, he was also recommended 1.5 mg dosage but he wanted to work on his diet first  He also continues to take 1500 mg of metformin without any side effects  Current blood sugar patterns and problems identified:  Since his last visit he has gained back weight and blood sugars are somewhat worse with A1c going up to 6.9, previously 6.6  He has not been able to walk much because of knee pain  However has not been watching his diet and eats desserts regularly, portion control can be better also  Did not bring his monitor for download today but he does not think his blood sugars are usually high even after meals.  However his lab glucose was 163 fasting   Side effects from medications:  none  Monitors blood glucose: Once a day or less.    Glucometer: Accucheck        Blood Glucose readings by recall: FASTING range 101-121; pc 165  Physical activity: exercise: Walking recently only 5-6 miles a week   Wt Readings from Last 3 Encounters:  07/07/15 185 lb 3.2 oz (84.006 kg)  02/25/15 175 lb 3.2 oz (79.47 kg)  11/26/14 183 lb 9.6 oz (83.28 kg)   Lab Results  Component Value Date   HGBA1C 6.9* 07/04/2015   HGBA1C 6.6* 02/22/2015   HGBA1C 8.2* 11/24/2014   Lab Results  Component Value Date   MICROALBUR <0.7 02/22/2015   LDLCALC 100* 07/04/2015   CREATININE 1.12 07/04/2015    Lab on 07/04/2015  Component Date Value Ref Range Status  . Hgb A1c MFr Bld  07/04/2015 6.9* 4.6 - 6.5 % Final   Glycemic Control Guidelines for People with Diabetes:Non Diabetic:  <6%Goal of Therapy: <7%Additional Action Suggested:  >8%   . Sodium 07/04/2015 142  135 - 145 mEq/L Final  . Potassium 07/04/2015 4.6  3.5 - 5.1 mEq/L Final  . Chloride 07/04/2015 104  96 - 112 mEq/L Final  . CO2 07/04/2015 32  19 - 32 mEq/L Final  . Glucose, Bld 07/04/2015 163* 70 - 99 mg/dL Final  . BUN 07/04/2015 25* 6 - 23 mg/dL Final  . Creatinine, Ser 07/04/2015 1.12  0.40 - 1.50 mg/dL Final  . Total Bilirubin 07/04/2015 0.7  0.2 - 1.2 mg/dL Final  . Alkaline Phosphatase 07/04/2015 48  39 - 117 U/L Final  . AST 07/04/2015 16  0 - 37 U/L Final  . ALT 07/04/2015 17  0 - 53 U/L Final  . Total Protein 07/04/2015 6.6  6.0 - 8.3 g/dL Final  . Albumin 07/04/2015 4.2  3.5 - 5.2 g/dL Final  . Calcium 07/04/2015 10.3  8.4 - 10.5 mg/dL Final  . GFR 07/04/2015 68.87  >60.00 mL/min Final  . Cholesterol 07/04/2015 170  0 - 200 mg/dL Final   ATP III Classification       Desirable:  < 200 mg/dL  Borderline High:  200 - 239 mg/dL          High:  > = 240 mg/dL  . Triglycerides 07/04/2015 80.0  0.0 - 149.0 mg/dL Final   Normal:  <150 mg/dLBorderline High:  150 - 199 mg/dL  . HDL 07/04/2015 54.80  >39.00 mg/dL Final  . VLDL 07/04/2015 16.0  0.0 - 40.0 mg/dL Final  . LDL Cholesterol 07/04/2015 100* 0 - 99 mg/dL Final  . Total CHOL/HDL Ratio 07/04/2015 3   Final                  Men          Women1/2 Average Risk     3.4          3.3Average Risk          5.0          4.42X Average Risk          9.6          7.13X Average Risk          15.0          11.0                      . NonHDL 07/04/2015 115.55   Final   NOTE:  Non-HDL goal should be 30 mg/dL higher than patient's LDL goal (i.e. LDL goal of < 70 mg/dL, would have non-HDL goal of < 100 mg/dL)      Medication List       This list is accurate as of: 07/07/15  4:58 PM.  Always use your most recent med list.                ACCU-CHEK NANO SMARTVIEW w/Device Kit  Use to check blood sugar once a day dx code E11.65     Dulaglutide 0.75 MG/0.5ML Sopn  Commonly known as:  TRULICITY  Inject once a week     glucose blood test strip  Commonly known as:  ACCU-CHEK SMARTVIEW  Use as instructed to check blood sugar once a day dx code E11.65     metFORMIN 750 MG 24 hr tablet  Commonly known as:  GLUCOPHAGE XR  Take 2 tablets daily     pravastatin 40 MG tablet  Commonly known as:  PRAVACHOL  Take 1 tablet (40 mg total) by mouth daily.     sildenafil 100 MG tablet  Commonly known as:  VIAGRA  Take 1 tablet (100 mg total) by mouth daily as needed for erectile dysfunction.        Allergies: No Known Allergies  Past Medical History  Diagnosis Date  . Macular degeneration     No past surgical history on file.  Family History  Problem Relation Age of Onset  . Diabetes Neg Hx   . Heart disease Neg Hx   . Stroke Neg Hx   . Hypertension Father     Social History:  reports that he has never smoked. He does not have any smokeless tobacco history on file. His alcohol and drug histories are not on file.  Review of Systems:  Has macular degeneration, last exam 3/16  No history of hypertension  but his blood pressure has been high normal Periodically Previously with low-dose ramipril he tended to have low blood pressure readings Currently not checking his blood pressure at home    HYPERLIPIDEMIA: The lipid abnormality consists of elevated LDL. He has continued to take pravastatin and  his LDL is Consistently around 100    Lab Results  Component Value Date   CHOL 170 07/04/2015   HDL 54.80 07/04/2015   LDLCALC 100* 07/04/2015   LDLDIRECT 144.3 02/16/2014   TRIG 80.0 07/04/2015   CHOLHDL 3 07/04/2015    He has had long-standing erectile dysfunction,  has been prescribed Viagra with good results, new prescription given today      Examination:   BP 134/86 mmHg  Pulse 87  Temp(Src) 97.8 F (36.6  C) (Oral)  Resp 14  Ht 6' (1.829 m)  Wt 185 lb 3.2 oz (84.006 kg)  BMI 25.11 kg/m2  SpO2 97%  Body mass index is 25.11 kg/(m^2).   ASSESSMENT/ PLAN:   Diabetes type 2 with BMI 25  The patient's diabetes control appears to be fair although A1c has gone up to 6.9 Lab glucose 163 He is probably taking blood sugar sporadically as he did not bring his monitor for review He has gained 10 pounds and this is likely to be from going off his diet since his last visit and not exercising as much  Since he will have his knee surgery in a month or so he will hopefully get back into walking which she normally does Encouraged him to improve his diet and also check more readings after meals consistently  HYPERTENSION: His blood pressure is higher now and advised him to start checking at home Most likely may need a low-dose ACE inhibitor such as enalapril  LIPIDS: Reasonably controlled with LDL 100 on pravastatin, can do better on diet   Jayelle Page 07/07/2015, 4:58 PM

## 2015-07-07 NOTE — Patient Instructions (Signed)
Bp check weekly  Check blood sugars on waking up 2  times a week Also check blood sugars about 2 hours after a meal and do this after different meals by rotation  Recommended blood sugar levels on waking up is 90-130 and about 2 hours after meal is 130-160  Please bring your blood sugar monitor to each visit, thank you  Better diet

## 2015-07-12 ENCOUNTER — Other Ambulatory Visit: Payer: Self-pay | Admitting: Physician Assistant

## 2015-07-18 ENCOUNTER — Encounter (HOSPITAL_COMMUNITY): Payer: Self-pay

## 2015-07-18 ENCOUNTER — Encounter (HOSPITAL_COMMUNITY)
Admission: RE | Admit: 2015-07-18 | Discharge: 2015-07-18 | Disposition: A | Discharge status: Home / Self Care | Payer: 59 | Source: Ambulatory Visit | Attending: Orthopaedic Surgery | Admitting: Orthopaedic Surgery

## 2015-07-18 ENCOUNTER — Other Ambulatory Visit (HOSPITAL_COMMUNITY): Payer: Self-pay | Admitting: *Deleted

## 2015-07-18 DIAGNOSIS — Z01818 Encounter for other preprocedural examination: Secondary | ICD-10-CM | POA: Insufficient documentation

## 2015-07-18 DIAGNOSIS — M1712 Unilateral primary osteoarthritis, left knee: Secondary | ICD-10-CM | POA: Diagnosis not present

## 2015-07-18 DIAGNOSIS — Z7984 Long term (current) use of oral hypoglycemic drugs: Secondary | ICD-10-CM | POA: Insufficient documentation

## 2015-07-18 DIAGNOSIS — Z79899 Other long term (current) drug therapy: Secondary | ICD-10-CM | POA: Insufficient documentation

## 2015-07-18 DIAGNOSIS — E119 Type 2 diabetes mellitus without complications: Secondary | ICD-10-CM | POA: Insufficient documentation

## 2015-07-18 DIAGNOSIS — E7800 Pure hypercholesterolemia, unspecified: Secondary | ICD-10-CM | POA: Insufficient documentation

## 2015-07-18 DIAGNOSIS — Z01812 Encounter for preprocedural laboratory examination: Secondary | ICD-10-CM | POA: Diagnosis not present

## 2015-07-18 HISTORY — DX: Hyperlipidemia, unspecified: E78.5

## 2015-07-18 HISTORY — DX: Type 2 diabetes mellitus without complications: E11.9

## 2015-07-18 HISTORY — DX: Unspecified osteoarthritis, unspecified site: M19.90

## 2015-07-18 HISTORY — DX: Male erectile dysfunction, unspecified: N52.9

## 2015-07-18 LAB — SURGICAL PCR SCREEN
MRSA, PCR: NEGATIVE
Staphylococcus aureus: NEGATIVE

## 2015-07-18 LAB — BASIC METABOLIC PANEL
ANION GAP: 7 (ref 5–15)
BUN: 18 mg/dL (ref 6–20)
CALCIUM: 9.8 mg/dL (ref 8.9–10.3)
CO2: 30 mmol/L (ref 22–32)
Chloride: 105 mmol/L (ref 101–111)
Creatinine, Ser: 0.96 mg/dL (ref 0.61–1.24)
GFR calc non Af Amer: >60 mL/min (ref >60)
Glucose, Bld: 183 mg/dL — ABNORMAL HIGH (ref 65–99)
POTASSIUM: 4.8 mmol/L (ref 3.5–5.1)
Sodium: 142 mmol/L (ref 135–145)

## 2015-07-18 LAB — CBC
HEMATOCRIT: 43.1 % (ref 39.0–52.0)
HEMOGLOBIN: 14.1 g/dL (ref 13.0–17.0)
MCH: 29.3 pg (ref 26.0–34.0)
MCHC: 32.7 g/dL (ref 30.0–36.0)
MCV: 89.4 fL (ref 78.0–100.0)
Platelets: 189 10*3/uL (ref 150–400)
RBC: 4.82 MIL/uL (ref 4.22–5.81)
RDW: 13.2 % (ref 11.5–15.5)
WBC: 7.3 10*3/uL (ref 4.0–10.5)

## 2015-07-18 LAB — GLUCOSE, CAPILLARY: Glucose-Capillary: 155 mg/dL — ABNORMAL HIGH (ref 65–99)

## 2015-07-18 NOTE — Pre-Procedure Instructions (Signed)
Juan Morales  07/18/2015      [facility] - [place], [place] - [address] [place] [address] Phone: [phone] Fax: [phone]  [id number] [place] [facility] [place] [address] [place] [address] Phone: [phone] Fax: [phone]    Your procedure is scheduled on 07-26-2015  Tuesday .  Report to [facility] Admitting at 11:00 A.M.   Call this number if you have problems the morning of surgery:  [phone]   Remember:  Do not eat food or drink liquids after midnight.   Take these medicines the morning of surgery with A SIP OF WATER Pravastatin(Pravachol)                Stop Aspirin, Aleve,Advil Ibuprofen,Goody's powders,fish oil ,vitamins and any over the counter supplements 5 days prior to surgery                                How to Manage Your Diabetes Before and After Surgery  Why is it important to control my blood sugar before and after surgery? . Improving blood sugar levels before and after surgery helps healing and can limit problems. . A way of improving blood sugar control is eating a healthy diet by: o  Eating less sugar and carbohydrates o  Increasing activity/exercise o  Talking with your doctor about reaching your blood sugar goals . High blood sugars (greater than 180 mg/dL) can raise your risk of infections and slow your recovery, so you will need to focus on controlling your diabetes during the weeks before surgery. . Make sure that the doctor who takes care of your diabetes knows about your planned surgery including the date and location.  How do I manage my blood sugar before surgery? . Check your blood sugar at least 4 times a day, starting 2 days before surgery, to make sure that the level is not too high or low. o Check your blood sugar the morning of your surgery when you wake up and every 2 hours until you get to the Short Stay unit. . If your blood sugar is  less than 70 mg/dL, you will need to treat for low blood sugar: o Do not take insulin. o Treat a low blood sugar (less than 70 mg/dL) with  cup of clear juice (cranberry or apple), 4 glucose tablets, OR glucose gel. o Recheck blood sugar in 15 minutes after treatment (to make sure it is greater than 70 mg/dL). If your blood sugar is not greater than 70 mg/dL on recheck, call [phone] for further instructions. . Report your blood sugar to the short stay nurse when you get to Short Stay.  . If you are admitted to the hospital after surgery: o Your blood sugar will be checked by the staff and you will probably be given insulin after surgery (instead of oral diabetes medicines) to make sure you have good blood sugar levels. o The goal for blood sugar control after surgery is 80-180 mg/dL.              WHAT DO I DO ABOUT MY DIABETES MEDICATION?   Marland Kitchen Do not take oral diabetes medicines (pills) the morning of surgery.         . The day of surgery, do not take other diabetes injectables, including Byetta (exenatide), Bydureon (exenatide ER), Victoza (liraglutide), or Trulicity (  dulaglutide).      Patient Signature:  Date:   Nurse Signature:  Date:   Reviewed and Endorsed by [facility] Patient Education Committee, August 2015   Do not wear jewelry,   Do not wear lotions, powders, or perfumes.  You may not wear deodorant.  Do not shave 48 hours prior to surgery.  Men may shave face and neck.  Do not bring valuables to the hospital.  [facility] is not responsible for any belongings or valuables.  Contacts, dentures or bridgework may not be worn into surgery.  Leave your suitcase in the car.  After surgery it may be brought to your room.  For patients admitted to the hospital, discharge time will be determined by your treatment team.  Patients discharged the day of surgery will not be allowed to drive home.    Special instructions:  See Attached Sheet for instructions  on CHG showers  Please read over the following fact sheets that you were given. Pain Booklet, Coughing and Deep Breathing and Surgical Site Infection Prevention

## 2015-07-26 ENCOUNTER — Ambulatory Visit (HOSPITAL_COMMUNITY): Payer: Medicare PPO | Admitting: Certified Registered Nurse Anesthetist

## 2015-07-26 ENCOUNTER — Encounter (HOSPITAL_COMMUNITY)
Admission: RE | Disposition: A | Discharge status: Home / Self Care | Payer: Self-pay | Source: Ambulatory Visit | Attending: Orthopaedic Surgery

## 2015-07-26 ENCOUNTER — Encounter (HOSPITAL_COMMUNITY): Payer: Self-pay | Admitting: Surgery

## 2015-07-26 ENCOUNTER — Observation Stay (HOSPITAL_COMMUNITY)
Admission: RE | Admit: 2015-07-26 | Discharge: 2015-07-27 | Disposition: A | Discharge status: Home / Self Care | Payer: Medicare PPO | Source: Ambulatory Visit | Attending: Orthopaedic Surgery | Admitting: Orthopaedic Surgery

## 2015-07-26 ENCOUNTER — Observation Stay (HOSPITAL_COMMUNITY): Payer: Medicare PPO

## 2015-07-26 DIAGNOSIS — N401 Enlarged prostate with lower urinary tract symptoms: Secondary | ICD-10-CM | POA: Insufficient documentation

## 2015-07-26 DIAGNOSIS — M179 Osteoarthritis of knee, unspecified: Secondary | ICD-10-CM | POA: Diagnosis not present

## 2015-07-26 DIAGNOSIS — Z471 Aftercare following joint replacement surgery: Secondary | ICD-10-CM | POA: Diagnosis not present

## 2015-07-26 DIAGNOSIS — E119 Type 2 diabetes mellitus without complications: Secondary | ICD-10-CM | POA: Diagnosis not present

## 2015-07-26 DIAGNOSIS — R338 Other retention of urine: Secondary | ICD-10-CM | POA: Insufficient documentation

## 2015-07-26 DIAGNOSIS — E7800 Pure hypercholesterolemia, unspecified: Secondary | ICD-10-CM | POA: Insufficient documentation

## 2015-07-26 DIAGNOSIS — Z79899 Other long term (current) drug therapy: Secondary | ICD-10-CM | POA: Diagnosis not present

## 2015-07-26 DIAGNOSIS — M1712 Unilateral primary osteoarthritis, left knee: Principal | ICD-10-CM

## 2015-07-26 DIAGNOSIS — H3530 Unspecified macular degeneration: Secondary | ICD-10-CM | POA: Insufficient documentation

## 2015-07-26 DIAGNOSIS — Z7984 Long term (current) use of oral hypoglycemic drugs: Secondary | ICD-10-CM | POA: Insufficient documentation

## 2015-07-26 DIAGNOSIS — R262 Difficulty in walking, not elsewhere classified: Secondary | ICD-10-CM | POA: Diagnosis not present

## 2015-07-26 DIAGNOSIS — Z96652 Presence of left artificial knee joint: Secondary | ICD-10-CM

## 2015-07-26 DIAGNOSIS — E785 Hyperlipidemia, unspecified: Secondary | ICD-10-CM | POA: Insufficient documentation

## 2015-07-26 HISTORY — PX: PARTIAL KNEE ARTHROPLASTY: SHX2174

## 2015-07-26 LAB — GLUCOSE, CAPILLARY
GLUCOSE-CAPILLARY: 237 mg/dL — AB (ref 65–99)
Glucose-Capillary: 139 mg/dL — ABNORMAL HIGH (ref 65–99)
Glucose-Capillary: 94 mg/dL (ref 65–99)

## 2015-07-26 SURGERY — ARTHROPLASTY, KNEE, UNICOMPARTMENTAL
Anesthesia: Monitor Anesthesia Care | Site: Knee | Laterality: Left

## 2015-07-26 MED ORDER — CHLORHEXIDINE GLUCONATE 4 % EX LIQD: 60.0000 mL | Freq: Once | CUTANEOUS | Status: DC

## 2015-07-26 MED ORDER — METOCLOPRAMIDE HCL 5 MG PO TABS
5.0000 mg | ORAL_TABLET | Freq: Three times a day (TID) | ORAL | Status: DC | PRN
Start: 2015-07-26 — End: 2015-07-27

## 2015-07-26 MED ORDER — MIDAZOLAM HCL 2 MG/2ML IJ SOLN
INTRAMUSCULAR | Status: AC
  Filled 2015-07-26: qty 2

## 2015-07-26 MED ORDER — ALUM & MAG HYDROXIDE-SIMETH 200-200-20 MG/5ML PO SUSP: 30.0000 mL | ORAL | Status: DC | PRN

## 2015-07-26 MED ORDER — MENTHOL 3 MG MT LOZG
1.0000 | LOZENGE | OROMUCOSAL | Status: DC | PRN
Start: 2015-07-26 — End: 2015-07-27

## 2015-07-26 MED ORDER — METFORMIN HCL ER 500 MG PO TB24
750.0000 mg | ORAL_TABLET | Freq: Two times a day (BID) | ORAL | Status: DC
  Administered 2015-07-27: 750 mg via ORAL

## 2015-07-26 MED ORDER — FENTANYL CITRATE (PF) 100 MCG/2ML IJ SOLN
INTRAMUSCULAR | Status: AC
  Filled 2015-07-26: qty 2

## 2015-07-26 MED ORDER — OXYCODONE HCL 5 MG PO TABS
5.0000 mg | ORAL_TABLET | ORAL | Status: DC | PRN
  Administered 2015-07-26: 5 mg via ORAL
  Administered 2015-07-26 – 2015-07-27 (×5): 10 mg via ORAL
  Filled 2015-07-26 (×5): qty 2

## 2015-07-26 MED ORDER — HYDROMORPHONE HCL 1 MG/ML IJ SOLN: 1.0000 mg | INTRAMUSCULAR | Status: DC | PRN

## 2015-07-26 MED ORDER — FENTANYL CITRATE (PF) 250 MCG/5ML IJ SOLN
INTRAMUSCULAR | Status: AC
  Filled 2015-07-26: qty 5

## 2015-07-26 MED ORDER — HYDROMORPHONE HCL 1 MG/ML IJ SOLN: 0.2500 mg | INTRAMUSCULAR | Status: DC | PRN

## 2015-07-26 MED ORDER — ACETAMINOPHEN 325 MG PO TABS: 650.0000 mg | ORAL_TABLET | Freq: Four times a day (QID) | ORAL | Status: DC | PRN

## 2015-07-26 MED ORDER — METHOCARBAMOL 500 MG PO TABS
500.0000 mg | ORAL_TABLET | Freq: Four times a day (QID) | ORAL | Status: DC | PRN
  Administered 2015-07-26: 500 mg via ORAL

## 2015-07-26 MED ORDER — BUPIVACAINE HCL (PF) 0.25 % IJ SOLN
INTRAMUSCULAR | Status: DC | PRN
  Administered 2015-07-26: 10 mL

## 2015-07-26 MED ORDER — MIDAZOLAM HCL 5 MG/5ML IJ SOLN
INTRAMUSCULAR | Status: DC | PRN
Start: 2015-07-26 — End: 2015-07-26
  Administered 2015-07-26 (×2): 1 mg via INTRAVENOUS

## 2015-07-26 MED ORDER — FENTANYL CITRATE (PF) 100 MCG/2ML IJ SOLN
INTRAMUSCULAR | Status: DC | PRN
Start: 2015-07-26 — End: 2015-07-26
  Administered 2015-07-26 (×2): 50 ug via INTRAVENOUS

## 2015-07-26 MED ORDER — ZOLPIDEM TARTRATE 5 MG PO TABS: 5.0000 mg | ORAL_TABLET | Freq: Every evening | ORAL | Status: DC | PRN

## 2015-07-26 MED ORDER — ONDANSETRON HCL 4 MG/2ML IJ SOLN: 4.0000 mg | Freq: Four times a day (QID) | INTRAMUSCULAR | Status: DC | PRN

## 2015-07-26 MED ORDER — ONDANSETRON HCL 4 MG PO TABS: 4.0000 mg | ORAL_TABLET | Freq: Four times a day (QID) | ORAL | Status: DC | PRN

## 2015-07-26 MED ORDER — SODIUM CHLORIDE 0.9 % IV SOLN
INTRAVENOUS | Status: DC
  Administered 2015-07-26: 16:00:00 via INTRAVENOUS

## 2015-07-26 MED ORDER — CEFAZOLIN SODIUM 1-5 GM-% IV SOLN
1.0000 g | Freq: Four times a day (QID) | INTRAVENOUS | Status: AC
  Administered 2015-07-26 – 2015-07-27 (×2): 1 g via INTRAVENOUS
  Filled 2015-07-26 (×3): qty 50

## 2015-07-26 MED ORDER — ACETAMINOPHEN 650 MG RE SUPP: 650.0000 mg | Freq: Four times a day (QID) | RECTAL | Status: DC | PRN

## 2015-07-26 MED ORDER — CEFAZOLIN SODIUM-DEXTROSE 2-4 GM/100ML-% IV SOLN
2.0000 g | INTRAVENOUS | Status: AC
  Administered 2015-07-26: 2 g via INTRAVENOUS
  Filled 2015-07-26: qty 100

## 2015-07-26 MED ORDER — PHENOL 1.4 % MT LIQD: 1.0000 | OROMUCOSAL | Status: DC | PRN

## 2015-07-26 MED ORDER — LACTATED RINGERS IV SOLN
INTRAVENOUS | Status: DC
  Administered 2015-07-26 (×2): via INTRAVENOUS

## 2015-07-26 MED ORDER — BUPIVACAINE HCL (PF) 0.25 % IJ SOLN
INTRAMUSCULAR | Status: AC
  Filled 2015-07-26: qty 30

## 2015-07-26 MED ORDER — METOCLOPRAMIDE HCL 5 MG/ML IJ SOLN: 5.0000 mg | Freq: Three times a day (TID) | INTRAMUSCULAR | Status: DC | PRN

## 2015-07-26 MED ORDER — KETOROLAC TROMETHAMINE 15 MG/ML IJ SOLN
INTRAMUSCULAR | Status: AC
  Filled 2015-07-26: qty 1

## 2015-07-26 MED ORDER — KETOROLAC TROMETHAMINE 15 MG/ML IJ SOLN
7.5000 mg | Freq: Four times a day (QID) | INTRAMUSCULAR | Status: AC
  Administered 2015-07-26 – 2015-07-27 (×4): 7.5 mg via INTRAVENOUS
  Filled 2015-07-26 (×3): qty 1

## 2015-07-26 MED ORDER — 0.9 % SODIUM CHLORIDE (POUR BTL) OPTIME
TOPICAL | Status: DC | PRN
  Administered 2015-07-26: 1000 mL

## 2015-07-26 MED ORDER — OXYCODONE HCL 5 MG PO TABS
ORAL_TABLET | ORAL | Status: AC
  Filled 2015-07-26: qty 1

## 2015-07-26 MED ORDER — ONDANSETRON HCL 4 MG/2ML IJ SOLN
INTRAMUSCULAR | Status: DC | PRN
  Administered 2015-07-26: 4 mg via INTRAVENOUS

## 2015-07-26 MED ORDER — DIPHENHYDRAMINE HCL 12.5 MG/5ML PO ELIX: 12.5000 mg | ORAL_SOLUTION | ORAL | Status: DC | PRN

## 2015-07-26 MED ORDER — PHENYLEPHRINE HCL 10 MG/ML IJ SOLN
10.0000 mg | INTRAMUSCULAR | Status: DC | PRN
  Administered 2015-07-26: 30 ug/min via INTRAVENOUS

## 2015-07-26 MED ORDER — METHOCARBAMOL 1000 MG/10ML IJ SOLN
500.0000 mg | Freq: Four times a day (QID) | INTRAVENOUS | Status: DC | PRN
  Filled 2015-07-26: qty 5

## 2015-07-26 MED ORDER — PRAVASTATIN SODIUM 40 MG PO TABS: 40.0000 mg | ORAL_TABLET | Freq: Every day | ORAL | Status: DC

## 2015-07-26 MED ORDER — PROPOFOL 500 MG/50ML IV EMUL
INTRAVENOUS | Status: DC | PRN
  Administered 2015-07-26: 100 ug/kg/min via INTRAVENOUS

## 2015-07-26 MED ORDER — INSULIN ASPART 100 UNIT/ML ~~LOC~~ SOLN: 0.0000 [IU] | Freq: Three times a day (TID) | SUBCUTANEOUS | Status: DC

## 2015-07-26 MED ORDER — RIVAROXABAN 10 MG PO TABS
10.0000 mg | ORAL_TABLET | Freq: Every day | ORAL | Status: DC
  Administered 2015-07-27: 10 mg via ORAL
  Filled 2015-07-26: qty 1

## 2015-07-26 MED ORDER — METHOCARBAMOL 500 MG PO TABS
ORAL_TABLET | ORAL | Status: AC
  Filled 2015-07-26: qty 1

## 2015-07-26 MED ORDER — BUPIVACAINE IN DEXTROSE 0.75-8.25 % IT SOLN
INTRATHECAL | Status: DC | PRN
  Administered 2015-07-26: 15 mg via INTRATHECAL

## 2015-07-26 MED ORDER — SODIUM CHLORIDE 0.9 % IR SOLN
Status: DC | PRN
  Administered 2015-07-26: 2000 mL

## 2015-07-26 SURGICAL SUPPLY — 68 items
BANDAGE ELASTIC 6 VELCRO ST LF (GAUZE/BANDAGES/DRESSINGS) ×2 IMPLANT
BANDAGE ESMARK 6X9 LF (GAUZE/BANDAGES/DRESSINGS) ×1 IMPLANT
BENZOIN TINCTURE PRP APPL 2/3 (GAUZE/BANDAGES/DRESSINGS) ×2 IMPLANT
BNDG ESMARK 6X9 LF (GAUZE/BANDAGES/DRESSINGS) ×2
BONE CEMENT PALACOSE (Orthopedic Implant) ×2 IMPLANT
BOWL SMART MIX CTS (DISPOSABLE) ×2 IMPLANT
CAPT KNEE PARTIAL 2 ×2 IMPLANT
CEMENT BONE PALACOSE (Orthopedic Implant) ×1 IMPLANT
COVER SURGICAL LIGHT HANDLE (MISCELLANEOUS) ×2 IMPLANT
CUFF TOURNIQUET SINGLE 34IN LL (TOURNIQUET CUFF) ×2 IMPLANT
CUFF TOURNIQUET SINGLE 44IN (TOURNIQUET CUFF) IMPLANT
DRAPE INCISE IOBAN 66X45 STRL (DRAPES) IMPLANT
DRAPE ORTHO SPLIT 77X108 STRL (DRAPES) ×2
DRAPE PROXIMA HALF (DRAPES) ×2 IMPLANT
DRAPE SURG ORHT 6 SPLT 77X108 (DRAPES) ×2 IMPLANT
DRAPE U-SHAPE 47X51 STRL (DRAPES) ×2 IMPLANT
DRSG PAD ABDOMINAL 8X10 ST (GAUZE/BANDAGES/DRESSINGS) ×4 IMPLANT
DURAPREP 26ML APPLICATOR (WOUND CARE) ×2 IMPLANT
ELECT CAUTERY BLADE 6.4 (BLADE) ×2 IMPLANT
ELECT REM PT RETURN 9FT ADLT (ELECTROSURGICAL) ×2
ELECTRODE REM PT RTRN 9FT ADLT (ELECTROSURGICAL) ×1 IMPLANT
FACESHIELD WRAPAROUND (MASK) ×4 IMPLANT
GAUZE SPONGE 4X4 12PLY STRL (GAUZE/BANDAGES/DRESSINGS) ×2 IMPLANT
GAUZE XEROFORM 1X8 LF (GAUZE/BANDAGES/DRESSINGS) IMPLANT
GLOVE BIOGEL PI IND STRL 8 (GLOVE) ×2 IMPLANT
GLOVE BIOGEL PI INDICATOR 8 (GLOVE) ×2
GLOVE ORTHO TXT STRL SZ7.5 (GLOVE) ×2 IMPLANT
GLOVE SURG ORTHO 8.0 STRL STRW (GLOVE) ×2 IMPLANT
GOWN STRL REUS W/ TWL LRG LVL3 (GOWN DISPOSABLE) ×2 IMPLANT
GOWN STRL REUS W/ TWL XL LVL3 (GOWN DISPOSABLE) ×2 IMPLANT
GOWN STRL REUS W/TWL LRG LVL3 (GOWN DISPOSABLE) ×2
GOWN STRL REUS W/TWL XL LVL3 (GOWN DISPOSABLE) ×2
HANDPIECE INTERPULSE COAX TIP (DISPOSABLE) ×1
IMMOBILIZER KNEE 22 UNIV (SOFTGOODS) ×2 IMPLANT
KIT BASIN OR (CUSTOM PROCEDURE TRAY) ×2 IMPLANT
KIT ROOM TURNOVER OR (KITS) ×2 IMPLANT
MANIFOLD NEPTUNE II (INSTRUMENTS) ×2 IMPLANT
NDL SAFETY ECLIPSE 18X1.5 (NEEDLE) IMPLANT
NEEDLE 22X1 1/2 (OR ONLY) (NEEDLE) ×2 IMPLANT
NEEDLE HYPO 18GX1.5 SHARP (NEEDLE)
NS IRRIG 1000ML POUR BTL (IV SOLUTION) ×2 IMPLANT
PACK BLADE SAW RECIP 70 3 PT (BLADE) ×2 IMPLANT
PACK TOTAL JOINT (CUSTOM PROCEDURE TRAY) ×2 IMPLANT
PACK UNIVERSAL I (CUSTOM PROCEDURE TRAY) IMPLANT
PAD ARMBOARD 7.5X6 YLW CONV (MISCELLANEOUS) ×2 IMPLANT
PAD CAST 4YDX4 CTTN HI CHSV (CAST SUPPLIES) ×1 IMPLANT
PADDING CAST COTTON 4X4 STRL (CAST SUPPLIES) ×1
PADDING CAST COTTON 6X4 STRL (CAST SUPPLIES) ×2 IMPLANT
SET HNDPC FAN SPRY TIP SCT (DISPOSABLE) ×1 IMPLANT
STAPLER VISISTAT 35W (STAPLE) IMPLANT
STRIP CLOSURE SKIN 1/2X4 (GAUZE/BANDAGES/DRESSINGS) ×2 IMPLANT
SUCTION FRAZIER HANDLE 10FR (MISCELLANEOUS) ×1
SUCTION TUBE FRAZIER 10FR DISP (MISCELLANEOUS) ×1 IMPLANT
SUT MNCRL AB 4-0 PS2 18 (SUTURE) ×2 IMPLANT
SUT VIC AB 0 CT1 27 (SUTURE) ×1
SUT VIC AB 0 CT1 27XBRD ANBCTR (SUTURE) ×1 IMPLANT
SUT VIC AB 1 CT1 27 (SUTURE) ×2
SUT VIC AB 1 CT1 27XBRD ANBCTR (SUTURE) ×2 IMPLANT
SUT VIC AB 2-0 CT1 27 (SUTURE) ×1
SUT VIC AB 2-0 CT1 TAPERPNT 27 (SUTURE) ×1 IMPLANT
SYR 50ML LL SCALE MARK (SYRINGE) IMPLANT
SYR CONTROL 10ML LL (SYRINGE) ×2 IMPLANT
TOWEL OR 17X24 6PK STRL BLUE (TOWEL DISPOSABLE) ×2 IMPLANT
TOWEL OR 17X26 10 PK STRL BLUE (TOWEL DISPOSABLE) ×2 IMPLANT
TRAY CATH 16FR W/PLASTIC CATH (SET/KITS/TRAYS/PACK) ×2 IMPLANT
TRAY FOLEY CATH 16FRSI W/METER (SET/KITS/TRAYS/PACK) IMPLANT
WATER STERILE IRR 1000ML POUR (IV SOLUTION) IMPLANT
WRAP KNEE MAXI GEL POST OP (GAUZE/BANDAGES/DRESSINGS) ×2 IMPLANT

## 2015-07-26 NOTE — Anesthesia Procedure Notes (Addendum)
Procedure Name: MAC Date/Time: 07/26/2015 1:09 PM Performed by: Fabian NovemberSOLHEIM, JOSHUA SALOMAN Pre-anesthesia Checklist: Patient identified, Patient being monitored, Timeout performed, Emergency Drugs available and Suction available Patient Re-evaluated:Patient Re-evaluated prior to inductionOxygen Delivery Method: Nasal cannula Preoxygenation: Pre-oxygenation with 100% oxygen Number of attempts: 1 Placement Confirmation: positive ETCO2 Dental Injury: Teeth and Oropharynx as per pre-operative assessment    Spinal Patient location during procedure: OR Start time: 07/26/2015 1:15 PM End time: 07/26/2015 1:19 PM Staffing Anesthesiologist: Gaynelle AduFITZGERALD, Laresha Bacorn Performed by: anesthesiologist  Preanesthetic Checklist Completed: patient identified, surgical consent, pre-op evaluation, timeout performed, IV checked, risks and benefits discussed and monitors and equipment checked Spinal Block Patient position: sitting Prep: DuraPrep Patient monitoring: cardiac monitor, continuous pulse ox and blood pressure Approach: midline Location: L3-4 Injection technique: single-shot Needle Needle type: Pencan  Needle gauge: 24 G Needle length: 9 cm Assessment Sensory level: T8 Additional Notes Functioning IV was confirmed and monitors were applied. Sterile prep and drape, including hand hygiene and sterile gloves were used. The patient was positioned and the spine was prepped. The skin was anesthetized with lidocaine.  Free flow of clear CSF was obtained prior to injecting local anesthetic into the CSF.  The spinal needle aspirated freely following injection.  The needle was carefully withdrawn.  The patient tolerated the procedure well.

## 2015-07-26 NOTE — H&P (Signed)
TOTAL KNEE ADMISSION H&P  Patient is being admitted for left partial knee arthroplasty.  Subjective:  Chief Complaint:left knee pain.  HPI: Juan Morales, 70 y.o. male, has a history of pain and functional disability in the left knee due to arthritis and has failed non-surgical conservative treatments for greater than 12 weeks to includeNSAID's and/or analgesics, corticosteriod injections, viscosupplementation injections, flexibility and strengthening excercises and activity modification.  Onset of symptoms was gradual, starting 5 years ago with gradually worsening course since that time. The patient noted prior procedures on the knee to include  arthroscopy on the left knee(s).  Patient currently rates pain in the left knee(s) at 9 out of 10 with activity. Patient has night pain, worsening of pain with activity and weight bearing, pain that interferes with activities of daily living, pain with passive range of motion, crepitus and joint swelling.  Patient has evidence of subchondral sclerosis, periarticular osteophytes and joint space narrowing by imaging studies. There is Morales active infection.  Patient Active Problem List   Diagnosis Date Noted  . Osteoarthritis of left knee 07/26/2015  . Type II or unspecified type diabetes mellitus without mention of complication, uncontrolled 10/29/2012  . Erectile dysfunction associated with type 2 diabetes mellitus ([place]) 10/29/2012  . Pure hypercholesterolemia 10/29/2012   Past Medical History  Diagnosis Date  . Macular degeneration   . Diabetes mellitus without complication ([place])   . Hyperlipidemia   . Erectile dysfunction   . Arthritis     Past Surgical History  Procedure Laterality Date  . Arthroscopic knee Left     times 2  . Eye surgery Bilateral     cataract removal    Prescriptions prior to admission  Medication Sig Dispense Refill Last Dose  . Blood Glucose Monitoring Suppl (ACCU-CHEK NANO SMARTVIEW) W/DEVICE KIT Use to check blood  sugar once a day dx code E11.65 1 kit 0 07/26/2015 at Unknown time  . calcium carbonate (OS-CAL - DOSED IN MG OF ELEMENTAL CALCIUM) 1250 (500 Ca) MG tablet Take 1 tablet by mouth daily.   Past Week at Unknown time  . Chromium 1000 MCG TABS Take 1 tablet by mouth daily.   Past Week at Unknown time  . Dulaglutide (TRULICITY) 0.17 PZ/0.2HE SOPN Inject once a week (Patient taking differently: Inject 0.75 mg into the muscle once a week. Inject once a week) 12 pen 1 Past Week at Unknown time  . glucose blood (ACCU-CHEK SMARTVIEW) test strip Use as instructed to check blood sugar once a day dx code E11.65 100 each 1 07/26/2015 at Unknown time  . metFORMIN (GLUCOPHAGE XR) 750 MG 24 hr tablet Take 2 tablets daily (Patient taking differently: Take 750 mg by mouth 2 (two) times daily. Take 2 tablets daily) 180 tablet 1 07/25/2015 at Unknown time  . Multiple Vitamins-Minerals (MULTIVITAMIN WITH MINERALS) tablet Take 1 tablet by mouth daily.   Past Week at Unknown time  . Omega-3 Fatty Acids (FISH OIL) 1000 MG CAPS Take 1 capsule by mouth daily.   Past Week at Unknown time  . pravastatin (PRAVACHOL) 40 MG tablet Take 1 tablet (40 mg total) by mouth daily. 90 tablet 1 07/26/2015 at 0707  . zinc gluconate 50 MG tablet Take 50 mg by mouth daily.   Past Week at Unknown time  . sildenafil (VIAGRA) 100 MG tablet Take 1 tablet (100 mg total) by mouth daily as needed for erectile dysfunction. 40 tablet 0    Morales Known Allergies  Social History  Substance Use  Topics  . Smoking status: Never Smoker   . Smokeless tobacco: Not on file  . Alcohol Use: Morales    Family History  Problem Relation Age of Onset  . Diabetes Neg Hx   . Heart disease Neg Hx   . Stroke Neg Hx   . Hypertension Father      Review of Systems  Musculoskeletal: Positive for joint pain.  All other systems reviewed and are negative.   Objective:  Physical Exam  Constitutional: He is oriented to person, place, and time. He appears well-developed and  well-nourished.  HENT:  Head: Normocephalic and atraumatic.  Eyes: EOM are normal. Pupils are equal, round, and reactive to light.  Neck: Normal range of motion. Neck supple.  Cardiovascular: Normal rate and regular rhythm.   Respiratory: Effort normal and breath sounds normal.  GI: Soft. Bowel sounds are normal.  Musculoskeletal:       Left knee: He exhibits swelling and abnormal alignment. Tenderness found. Medial joint line tenderness noted.  Neurological: He is alert and oriented to person, place, and time.  Skin: Skin is warm and dry.  Psychiatric: He has a normal mood and affect.    Vital signs in last 24 hours: Temp:  [98.2 F (36.8 C)] 98.2 F (36.8 C) (05/02 1109) Pulse Rate:  [75] 75 (05/02 1109) Resp:  [20] 20 (05/02 1109) BP: (139)/(93) 139/93 mmHg (05/02 1109) SpO2:  [100 %] 100 % (05/02 1109) Weight:  [82.555 kg (182 lb)] 82.555 kg (182 lb) (05/02 1109)  Labs:   Estimated body mass index is 24.02 kg/(m^2) as calculated from the following:   Height as of this encounter: _0  (1.854 m).   Weight as of this encounter: 82.555 kg (182 lb).   Imaging Review Plain radiographs demonstrate severe degenerative joint disease of the left knee medial compartment. The overall alignment ismild varus. The bone quality appears to be excellent for age and reported activity level.  Assessment/Plan:  End stage arthritis, left knee medial compartment  The patient history, physical examination, clinical judgment of the provider and imaging studies are consistent with end stage degenerative joint disease of the left knee(s) and partial knee arthroplasty is deemed medically necessary. The treatment options including medical management, injection therapy arthroscopy and arthroplasty were discussed at length. The risks and benefits of a partial knee arthroplasty were presented and reviewed. The risks due to aseptic loosening, infection, stiffness, patella tracking problems,  thromboembolic complications and other imponderables were discussed. The patient acknowledged the explanation, agreed to proceed with the plan and consent was signed. Patient is being admitted for inpatient treatment for surgery, pain control, PT, OT, prophylactic antibiotics, VTE prophylaxis, progressive ambulation and ADL's and discharge planning. The patient is planning to be discharged home with home health services

## 2015-07-26 NOTE — Transfer of Care (Signed)
Immediate Anesthesia Transfer of Care Note  Patient: Alain MarionGary R Standish  Procedure(s) Performed: Procedure(s): LEFT UNICOMPARTMENTAL KNEE ARTHROPLASTY (Left)  Patient Location: PACU  Anesthesia Type:MAC and Spinal  Level of Consciousness: awake, alert , oriented and patient cooperative  Airway & Oxygen Therapy: Patient Spontanous Breathing and Patient connected to nasal cannula oxygen  Post-op Assessment: Report given to RN and Post -op Vital signs reviewed and stable  Post vital signs: Reviewed and stable  Last Vitals:  Filed Vitals:   07/26/15 1109  BP: 139/93  Pulse: 75  Temp: 36.8 C  Resp: 20    Last Pain: There were no vitals filed for this visit.    Patients Stated Pain Goal: 4 (07/26/15 1132)  Complications: No apparent anesthesia complications

## 2015-07-26 NOTE — Brief Op Note (Signed)
07/26/2015  3:36 PM  PATIENT:  Juan Morales  70 y.o. male  PRE-OPERATIVE DIAGNOSIS:  left knee medial compartment osteoarthritis  POST-OPERATIVE DIAGNOSIS:  left knee medial compartment osteoarthritis  PROCEDURE:  Procedure(s): LEFT UNICOMPARTMENTAL KNEE ARTHROPLASTY (Left)  SURGEON:  Surgeon(s) and Role:    * Kathryne Hitchhristopher Y Ferris Tally, MD - Primary  PHYSICIAN ASSISTANT: Rexene EdisonGil Clark, PA-C  ANESTHESIA:   local and spinal  EBL:  Total I/O In: 1000 [I.V.:1000] Out: 10 [Blood:10]  LOCAL MEDICATIONS USED:  MARCAINE     COUNTS:  YES  TOURNIQUET:   Total Tourniquet Time Documented: Thigh (Left) - 115 minutes Total: Thigh (Left) - 115 minutes   DICTATION: .Other Dictation: Dictation Number [id number]  PLAN OF CARE: Admit for overnight observation  PATIENT DISPOSITION:  PACU - hemodynamically stable.   Delay start of Pharmacological VTE agent (>24hrs) due to surgical blood loss or risk of bleeding: no

## 2015-07-26 NOTE — Anesthesia Preprocedure Evaluation (Addendum)
Anesthesia Evaluation  Patient identified by MRN, date of birth, ID band Patient awake    Reviewed: Allergy & Precautions, H&P , NPO status , Patient's Chart, lab work & pertinent test results  History of Anesthesia Complications Negative for: history of anesthetic complications  Airway Mallampati: II  TM Distance: >3 FB Neck ROM: Full    Dental no notable dental hx. (+) Teeth Intact, Dental Advisory Given   Pulmonary neg pulmonary ROS,    Pulmonary exam normal breath sounds clear to auscultation       Cardiovascular negative cardio ROS   Rhythm:Regular Rate:Normal     Neuro/Psych negative neurological ROS  negative psych ROS   GI/Hepatic negative GI ROS, Neg liver ROS,   Endo/Other  negative endocrine ROSdiabetes, Well Controlled, Type 2, Oral Hypoglycemic Agents  Renal/GU negative Renal ROS  negative genitourinary   Musculoskeletal  (+) Arthritis , Osteoarthritis,    Abdominal   Peds negative pediatric ROS (+)  Hematology negative hematology ROS (+)   Anesthesia Other Findings   Reproductive/Obstetrics negative OB ROS                          BP Readings from Last 3 Encounters:  07/18/15 138/87  07/07/15 134/86  02/25/15 130/80   Lab Results  Component Value Date   WBC 7.3 07/18/2015   HGB 14.1 07/18/2015   HCT 43.1 07/18/2015   MCV 89.4 07/18/2015   PLT 189 07/18/2015     Chemistry      Component Value Date/Time   NA 142 07/18/2015 0908   K 4.8 07/18/2015 0908   CL 105 07/18/2015 0908   CO2 30 07/18/2015 0908   BUN 18 07/18/2015 0908   CREATININE 0.96 07/18/2015 0908      Component Value Date/Time   CALCIUM 9.8 07/18/2015 0908   ALKPHOS 48 07/04/2015 0809   AST 16 07/04/2015 0809   ALT 17 07/04/2015 0809   BILITOT 0.7 07/04/2015 0809     Lab Results  Component Value Date   HGBA1C 6.9* 07/04/2015   EKG 07/18/15: Normal sinus rhythm Possible Left atrial  enlargement   Anesthesia Physical Anesthesia Plan  ASA: II  Anesthesia Plan: Spinal and MAC   Post-op Pain Management:    Induction: Intravenous  Airway Management Planned: Simple Face Mask  Additional Equipment:   Intra-op Plan:   Post-operative Plan:   Informed Consent: I have reviewed the patients History and Physical, chart, labs and discussed the procedure including the risks, benefits and alternatives for the proposed anesthesia with the patient or authorized representative who has indicated his/her understanding and acceptance.   Dental advisory given  Plan Discussed with: CRNA  Anesthesia Plan Comments:         Anesthesia Quick Evaluation

## 2015-07-26 NOTE — Anesthesia Postprocedure Evaluation (Signed)
Anesthesia Post Note  Patient: Alain MarionGary R Hieronymus  Procedure(s) Performed: Procedure(s) (LRB): LEFT UNICOMPARTMENTAL KNEE ARTHROPLASTY (Left)  Patient location during evaluation: PACU Anesthesia Type: Spinal and MAC Level of consciousness: awake and alert Pain management: pain level controlled Vital Signs Assessment: post-procedure vital signs reviewed and stable Respiratory status: spontaneous breathing and respiratory function stable Cardiovascular status: blood pressure returned to baseline and stable Postop Assessment: spinal receding Anesthetic complications: no    Last Vitals:  Filed Vitals:   07/26/15 1622 07/26/15 1645  BP: 138/84 146/88  Pulse: 59 63  Temp:    Resp: 15 20    Last Pain:  Filed Vitals:   07/26/15 1647  PainSc: 0-No pain                 Huntley Knoop,W. EDMOND

## 2015-07-27 DIAGNOSIS — M1712 Unilateral primary osteoarthritis, left knee: Secondary | ICD-10-CM | POA: Diagnosis not present

## 2015-07-27 LAB — GLUCOSE, CAPILLARY
GLUCOSE-CAPILLARY: 191 mg/dL — AB (ref 65–99)
Glucose-Capillary: 165 mg/dL — ABNORMAL HIGH (ref 65–99)

## 2015-07-27 MED ORDER — OXYCODONE-ACETAMINOPHEN 5-325 MG PO TABS: 1.0000 | ORAL_TABLET | ORAL | Status: DC | PRN

## 2015-07-27 MED ORDER — METHOCARBAMOL 500 MG PO TABS: 500.0000 mg | ORAL_TABLET | Freq: Four times a day (QID) | ORAL | Status: DC | PRN

## 2015-07-27 MED ORDER — TAMSULOSIN HCL 0.4 MG PO CAPS: 0.4000 mg | ORAL_CAPSULE | Freq: Every day | ORAL | Status: DC

## 2015-07-27 MED ORDER — TAMSULOSIN HCL 0.4 MG PO CAPS
0.4000 mg | ORAL_CAPSULE | Freq: Once | ORAL | Status: AC
  Administered 2015-07-27: 0.4 mg via ORAL
  Filled 2015-07-27: qty 1

## 2015-07-27 MED ORDER — RIVAROXABAN 10 MG PO TABS: 10.0000 mg | ORAL_TABLET | Freq: Every day | ORAL | Status: DC

## 2015-07-27 NOTE — Discharge Summary (Signed)
Patient ID: Juan Morales MRN: [id number] DOB/AGE: 01-Sep-1945 70 y.o.  Admit date: 07/26/2015 Discharge date: 07/27/2015  Admission Diagnoses:  Principal Problem:   Osteoarthritis of left knee Active Problems:   Status post left partial knee replacement   Discharge Diagnoses:  Same  Past Medical History  Diagnosis Date  . Macular degeneration   . Diabetes mellitus without complication ([employer])   . Hyperlipidemia   . Erectile dysfunction   . Arthritis     Surgeries: Procedure(s): LEFT UNICOMPARTMENTAL KNEE ARTHROPLASTY on 07/26/2015   Consultants:    Discharged Condition: Improved  Hospital Course: Juan Morales is an 70 y.o. male who was admitted 07/26/2015 for operative treatment ofOsteoarthritis of left knee. Patient has severe unremitting pain that affects sleep, daily activities, and work/hobbies. After pre-op clearance the patient was taken to the operating room on 07/26/2015 and underwent  Procedure(s): LEFT UNICOMPARTMENTAL KNEE ARTHROPLASTY.    Patient was given perioperative antibiotics: Anti-infectives    Start     Dose/Rate Route Frequency Ordered Stop   07/26/15 2030  ceFAZolin (ANCEF) IVPB 1 g/50 mL premix     1 g 100 mL/hr over 30 Minutes Intravenous Every 6 hours 07/26/15 1938 07/27/15 0304   07/26/15 1114  ceFAZolin (ANCEF) IVPB 2g/100 mL premix     2 g 200 mL/hr over 30 Minutes Intravenous On call to O.R. 07/26/15 1114 07/26/15 1351       Patient was given sequential compression devices, early ambulation, and chemoprophylaxis to prevent DVT.  Patient benefited maximally from hospital stay and there were no complications.    Recent vital signs: Patient Vitals for the past 24 hrs:  BP Temp Temp src Pulse Resp SpO2 Height Weight  07/27/15 0610 139/78 mmHg 98.6 F (37 C) - 85 16 98 % - -  07/26/15 2332 (!) 161/90 mmHg 98 F (36.7 C) Oral 91 16 100 % - -  07/26/15 1849 (!) 170/91 mmHg 97.6 F (36.4 C) - 69 16 100 % - -  07/26/15 1830 - 97.9 F (36.6 C) - - -  - - -  07/26/15 1825 - - - 72 - 100 % - -  07/26/15 1824 - - - - 15 - - -  07/26/15 1821 (!) 162/92 mmHg - - - - - - -  07/26/15 1745 - - - 61 13 100 % - -  07/26/15 1736 (!) 162/89 mmHg - - - - - - -  07/26/15 1714 - - - 64 12 100 % - -  07/26/15 1653 - - - (!) 57 13 100 % - -  07/26/15 1651 (!) 150/90 mmHg - - - - - - -  07/26/15 1645 (!) 146/88 mmHg - - 63 20 100 % - -  07/26/15 1622 138/84 mmHg - - (!) 59 15 100 % - -  07/26/15 1615 - - - (!) 59 13 100 % - -  07/26/15 1606 130/88 mmHg - - - - - - -  07/26/15 1559 - - - 63 (!) 9 100 % - -  07/26/15 1554 - 97.5 F (36.4 C) - - - - - -  07/26/15 1552 113/77 mmHg - - - - - - -  07/26/15 1109 (!) 139/93 mmHg 98.2 F (36.8 C) Oral 75 20 100 % '6\' 1"'  (1.854 m) 82.555 kg (182 lb)     Recent laboratory studies: No results for input(s): WBC, HGB, HCT, PLT, NA, K, CL, CO2, BUN, CREATININE, GLUCOSE, INR, CALCIUM in the last 72 hours.  Invalid input(s): PT, 2   Discharge Medications:     Medication List    TAKE these medications        ACCU-CHEK NANO SMARTVIEW w/Device Kit  Use to check blood sugar once a day dx code E11.65     calcium carbonate 1250 (500 Ca) MG tablet  Commonly known as:  OS-CAL - dosed in mg of elemental calcium  Take 1 tablet by mouth daily.     Chromium 1000 MCG Tabs  Take 1 tablet by mouth daily.     Dulaglutide 0.75 MG/0.5ML Sopn  Commonly known as:  TRULICITY  Inject once a week     Fish Oil 1000 MG Caps  Take 1 capsule by mouth daily.     glucose blood test strip  Commonly known as:  ACCU-CHEK SMARTVIEW  Use as instructed to check blood sugar once a day dx code E11.65     metFORMIN 750 MG 24 hr tablet  Commonly known as:  GLUCOPHAGE XR  Take 2 tablets daily     methocarbamol 500 MG tablet  Commonly known as:  ROBAXIN  Take 1 tablet (500 mg total) by mouth every 6 (six) hours as needed for muscle spasms.     multivitamin with minerals tablet  Take 1 tablet by mouth daily.      oxyCODONE-acetaminophen 5-325 MG tablet  Commonly known as:  ROXICET  Take 1-2 tablets by mouth every 4 (four) hours as needed.     pravastatin 40 MG tablet  Commonly known as:  PRAVACHOL  Take 1 tablet (40 mg total) by mouth daily.     rivaroxaban 10 MG Tabs tablet  Commonly known as:  XARELTO  Take 1 tablet (10 mg total) by mouth daily with breakfast.     sildenafil 100 MG tablet  Commonly known as:  VIAGRA  Take 1 tablet (100 mg total) by mouth daily as needed for erectile dysfunction.     zinc gluconate 50 MG tablet  Take 50 mg by mouth daily.        Diagnostic Studies: Dg Knee Left Port  07/26/2015  CLINICAL DATA:  Postop left partial knee replacement EXAM: PORTABLE LEFT KNEE - 1-2 VIEW COMPARISON:  None. FINDINGS: Interval left hemi medial femorotibial compartment arthroplasty without failure or complication. Normal alignment. No acute fracture or dislocation. Soft tissue emphysema in the surrounding soft tissues. 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 IMPRESSION: Interval left hemi medial femorotibial compartment arthroplasty without failure or complication. Electronically Signed   By: Kathreen Devoid   On: 07/26/2015 16:51    Disposition: to home      Discharge Instructions    Call MD / Call 911    Complete by:  As directed   If you experience chest pain or shortness of breath, CALL 911 and be transported to the hospital emergency room.  If you develope a fever above 101 F, pus (white drainage) or increased drainage or redness at the wound, or calf pain, call your surgeon's office.     Constipation Prevention    Complete by:  As directed   Drink plenty of fluids.  Prune juice may be helpful.  You may use a stool softener, such as Colace (over the counter) 100 mg twice a day.  Use MiraLax (over the counter) for constipation as needed.     Diet - low sodium heart healthy    Complete by:  As directed      Discharge patient  Complete by:  As directed      Increase activity slowly as tolerated    Complete by:  As directed            Follow-up Information    Follow up with Mcarthur Rossetti, MD In 2 weeks.   Specialty:  Orthopedic Surgery   Contact information:   [place] [address] [phone]        Signed: Mcarthur Rossetti 07/27/2015, 7:02 AM

## 2015-07-27 NOTE — Progress Notes (Signed)
Dr. Doneen Poissonhristopher Blackman called about patient unable to void after 2 I&O catheterizations, and the last bladder scan revealed 319cc of urine.  It was reported from nights that patient had I&O cath resulting in 999cc of urine and another I&O cath resulting in 1,000cc of urine.  Order obtained for foley with leg bag and he will work on getting a Urology consult.

## 2015-07-27 NOTE — Care Management Obs Status (Signed)
MEDICARE OBSERVATION STATUS NOTIFICATION   Patient Details  Name: Juan Morales MRN: [id number] Date of Birth: 05/25/1945   Medicare Observation Status Notification Given:  Yes    Durenda GuthrieBrady, Jnaya Butrick Naomi, RN 07/27/2015, 2:49 PM

## 2015-07-27 NOTE — Progress Notes (Signed)
Subjective: 1 Day Post-Op Procedure(s) (LRB): LEFT UNICOMPARTMENTAL KNEE ARTHROPLASTY (Left) Patient reports pain as mild.  With history of BPH, having trouble urinating.  Objective: Vital signs in last 24 hours: Temp:  [97.5 F (36.4 C)-98.6 F (37 C)] 98.6 F (37 C) (05/03 0610) Pulse Rate:  [57-91] 85 (05/03 0610) Resp:  [9-20] 16 (05/03 0610) BP: (113-170)/(77-93) 139/78 mmHg (05/03 0610) SpO2:  [98 %-100 %] 98 % (05/03 0610) Weight:  [82.555 kg (182 lb)] 82.555 kg (182 lb) (05/02 1109)  Intake/Output from previous day: 05/02 0701 - 05/03 0700 In: 2160 [P.O.:60; I.V.:2100] Out: 1910 [Urine:1900; Blood:10] Intake/Output this shift: Total I/O In: -  Out: 1000 [Urine:1000]  No results for input(s): HGB in the last 72 hours. No results for input(s): WBC, RBC, HCT, PLT in the last 72 hours. No results for input(s): NA, K, CL, CO2, BUN, CREATININE, GLUCOSE, CALCIUM in the last 72 hours. No results for input(s): LABPT, INR in the last 72 hours.  Sensation intact distally Intact pulses distally Dorsiflexion/Plantar flexion intact Incision: scant drainage No cellulitis present Compartment soft  Assessment/Plan: 1 Day Post-Op Procedure(s) (LRB): LEFT UNICOMPARTMENTAL KNEE ARTHROPLASTY (Left) Up with therapy Discharge home with home health today.  Juan Morales Y 07/27/2015, 6:59 AM

## 2015-07-27 NOTE — Progress Notes (Signed)
MD at bedside at this. MD made aware of issues with urination last night; RN and MD discussed problems plan of action in presence of patient. Pt agreeable to plan.

## 2015-07-27 NOTE — Evaluation (Signed)
Physical Therapy Evaluation Patient Details Name: Juan Morales MRN: [id number] DOB: [birth year] Today's Date: 07/27/2015   History of Present Illness  Pt is a 70 y.o. male now s/p Lt unicompartmental knee arthroplasty. PMH: diabetes.  Clinical Impression  Pt is s/p unicompartmental knee arthroplasty resulting in the deficits listed below (see PT Problem List).  Pt will benefit from skilled PT to increase their independence and safety with mobility to allow discharge to home with spouse to assist. The patient's spouse was present for the session. Both deny questions following initial session.       Follow Up Recommendations Home health PT;Supervision for mobility/OOB    Equipment Recommendations  Rolling walker with 5" wheels    Recommendations for Other Services       Precautions / Restrictions Precautions Precautions: Knee;Fall Precaution Booklet Issued: Yes (comment) Precaution Comments: HEP provided, reviewed knee extension precautions Restrictions Weight Bearing Restrictions: Yes LLE Weight Bearing: Weight bearing as tolerated      Mobility  Bed Mobility Overal bed mobility: Needs Assistance Bed Mobility: Supine to Sit     Supine to sit: Modified independent (Device/Increase time)     General bed mobility comments: using rail to assist  Transfers Overall transfer level: Needs assistance Equipment used: None Transfers: Sit to/from Stand Sit to Stand: Supervision         General transfer comment: not using device for transfer. Cues provided for safety with transfers (reach for chair, fully turn prior to sitting)   Ambulation/Gait Ambulation/Gait assistance: Min guard;Supervision Ambulation Distance (Feet): 160 Feet Assistive device: Rolling walker (2 wheeled) Gait Pattern/deviations: Step-through pattern;Decreased step length - right;Decreased weight shift to left Gait velocity: decreased   General Gait Details: cues for even strides and weightbearing  through LLE.   Stairs            Wheelchair Mobility    Modified Rankin (Stroke Patients Only)       Balance Overall balance assessment: Needs assistance Sitting-balance support: No upper extremity supported Sitting balance-Leahy Scale: Good     Standing balance support: During functional activity Standing balance-Leahy Scale: Fair Standing balance comment: using rw for ambulation                             Pertinent Vitals/Pain Pain Assessment: 0-10 Pain Score: 0-No pain (none while at rest)    Home Living Family/patient expects to be discharged to:: Private residence Living Arrangements: Spouse/significant other Available Help at Discharge: Family;Available PRN/intermittently Type of Home: House Home Access: Level entry     Home Layout: Two level (shower on second level) Home Equipment: Other (comment) (walking stick) Additional Comments: spouse available as needed.     Prior Function Level of Independence: Independent               Hand Dominance        Extremity/Trunk Assessment   Upper Extremity Assessment: Overall WFL for tasks assessed           Lower Extremity Assessment: LLE deficits/detail   LLE Deficits / Details: poor quad activation     Communication   Communication: No difficulties  Cognition Arousal/Alertness: Awake/alert Behavior During Therapy: WFL for tasks assessed/performed Overall Cognitive Status: Within Functional Limits for tasks assessed                      General Comments      Exercises Total Joint Exercises Ankle Circles/Pumps: AROM;Both;10 reps [employer]  Sets: Strengthening;Left;10 reps Heel Slides: AAROM;Left;10 reps Straight Leg Raises: Strengthening;Left;5 reps (min assist) Goniometric ROM: 12-82 degrees      Assessment/Plan    PT Assessment Patient needs continued PT services  PT Diagnosis Difficulty walking   PT Problem List Decreased strength;Decreased range of  motion;Decreased activity tolerance;Decreased balance;Decreased mobility  PT Treatment Interventions DME instruction;Gait training;Stair training;Therapeutic activities;Functional mobility training;Therapeutic exercise;Balance training;Patient/family education   PT Goals (Current goals can be found in the Care Plan section) Acute Rehab PT Goals Patient Stated Goal: get back to hiking and golfing PT Goal Formulation: With patient Time For Goal Achievement: 08/10/15 Potential to Achieve Goals: Good    Frequency 7X/week   Barriers to discharge        Co-evaluation               End of Session Equipment Utilized During Treatment: Gait belt Activity Tolerance: Patient tolerated treatment well Patient left: in chair;with call bell/phone within reach;with family/visitor present;Other (comment) (in knee extension stretch) Nurse Communication: Mobility status    Functional Assessment Tool Used: clinical judgment Functional Limitation: Mobility: Walking and moving around Mobility: Walking and Moving Around Current Status (I6962(G8978): At least 20 percent but less than 40 percent impaired, limited or restricted Mobility: Walking and Moving Around Goal Status [id number](G8979): At least 1 percent but less than 20 percent impaired, limited or restricted    Time: 1026-1109 PT Time Calculation (min) (ACUTE ONLY): 43 min   Charges:   PT Evaluation $PT Eval Moderate Complexity: 1 Procedure PT Treatments $Gait Training: 8-22 mins $Therapeutic Exercise: 8-22 mins   PT G Codes:   PT G-Codes **NOT FOR INPATIENT CLASS** Functional Assessment Tool Used: clinical judgment Functional Limitation: Mobility: Walking and moving around Mobility: Walking and Moving Around Current Status ([id number](G8978): At least 20 percent but less than 40 percent impaired, limited or restricted Mobility: Walking and Moving Around Goal Status [phone](G8979): At least 1 percent but less than 20 percent impaired, limited or restricted     Christiane HaBenjamin J. Fredonia Casalino, PT, CSCS Pager [phone] Office [phone]  07/27/2015, 1:15 PM

## 2015-07-27 NOTE — Progress Notes (Signed)
I Spoke with Richardean CanalGilbert Clark to inform him about Patient's spouse concerned that urine is blood tinged.  Sullivan LoneGilbert stated that patient had some blood in urine even in the OR and it was most likely due to irritation or possible small tear in urethra. Also that when he follows up with the urologist they may want to get a UA, but they will decide what is best for him. Patient and his spouse informed of this and okay with this.  While instructing them on foley care urine was clear amber.

## 2015-07-27 NOTE — Op Note (Signed)
[name], Morales NO.:  [id number]  MEDICAL RECORD NO.:  [id number]  LOCATION:  [id number]                        FACILITY:  [facility]  PHYSICIAN:  Juan Morales. Juan Morales, M.D.DATE OF BIRTH:  1945/12/02  DATE OF PROCEDURE:  07/26/2015 DATE OF DISCHARGE:                              OPERATIVE REPORT   PREOPERATIVE DIAGNOSIS:  Left knee medial compartment osteoarthritis with mild varus deformity.  POSTOPERATIVE DIAGNOSIS:  Left knee medial compartment osteoarthritis with mild varus deformity.  PROCEDURE:  Left unicompartmental partial knee arthroplasty.  IMPLANTS:  [employer] unicompartmental knee system with left size large femur, size D tibial tray for left knee, 6 mm mobile bearing polyethylene insert.  SURGEON:  Juan Morales. Juan Morales, M.D.  ASSISTANT:  Juan Canal, PA-C.  ANESTHESIA: 1. Spinal. 2. Local with plain Marcaine.  TOURNIQUET TIME:  Under 2 hours.  BLOOD LOSS:  Less than 100 mL.  COMPLICATIONS:  None.  INDICATIONS:  Juan Morales is a 70 year old gentleman with known osteoarthritis involving his left knee.  He has had at least two arthroscopic interventions of this knee with complete loss of the joint space on the medial side, he has had a varus deformity.  He has had a partial medial meniscectomy and radiographic evidence of varus deformity with significant arthritic findings.  His varus deformity is easily correctable.  His ACL is intact.  He does have some mild patellofemoral arthritis, but no symptoms in this area.  He has tried and failed 2 arthroscopic interventions as well as multiple steroid injections, and supplement injections to his knee.  At this point, he wished to proceed with a knee replacement and this we feel can be done through a partial knee arthroplasty.  After explaining the risks and benefits of partial and total knees and showed him a knee model, we talked about the risk of acute blood loss anemia, nerve and  vessel injury, fracture, infection, DVT as well as __________.  We talked about the goals of decreased pain, improved mobility, and overall improved quality of life.  He did opt for a partial knee replacement based on his clinical findings.  PROCEDURE DESCRIPTION:  After informed consent was obtained, appropriate left knee was marked, and he was brought to the operating room, and anesthesia obtained via spinal anesthesia.  He was then laid in supine position.  A nonsterile tourniquet was placed around his upper left thigh and his left leg was prepped and draped from the thigh down the ankle with DuraPrep and sterile drapes including a sterile stockinette. A Well-Leg holder was placed proximal to the popliteal area into the thigh before prepping and draping, and his right leg was placed in a well-leg holder.  Time-out was called to identify the correct patient and correct left knee.  We then used Esmarch to wrap out the leg and tourniquet was inflated to 300 mm of pressure.  We then made an incision just medial to the patella proximally and carried this down to the tibia.  We performed a medial parapatellar arthrotomy and found a large joint effusion and found the medial space completely devoid of cartilage.  The lateral side was pristine, but there was definitely wear of  the patellofemoral joint, but again this was asymptomatic.  His ACL and PCL were intact, and his varus deformity was correctable.  We then removed remnants of the medial meniscus and used our sizing spoons to trial for a size large femur.  We then based off a large femur, put our G-clamp on our tibial cutting guide for making our tibia cut with a sagittal saw and reciprocating saw.  We then moved this part of bone and the pins.  We went to the femur and made our femoral axis line down the middle aspect of the medial femoral condyle and put our intramedullary guide, so we could put our cutting guide to make our distal  femoral cut and then our posterior femoral cut.  We then began milling and milled for a large femur, and with a size D tibial tray, which we chose and milling for a 0 femur, we then trialed and felt that the 6 poly would be appropriate, so then we milled the femur up to a size 5.  With the size 5 femur and the size D tibial tray in place, we trialed our 6 mm polyethylene insert with flexion and extension, and we felt our gaps were equal.  We then did our finishing tibia cut and our finishing femoral cut.  We then mixed our cement and cemented the real size D tibial tray followed by the real large femur for left knee.  We placed a 6 mm polyethylene insert just to allow the cement compression.  We removed all cement debris from the knee and then we irrigated the knee with normal saline solution with pulsatile lavage.  After the cement had hardened, we then placed the real 6 mm mobile bearing polyethylene insert.  We were pleased with the range of motion and stability of the knee.  We then irrigated the knee again with normal saline solution.  We closed the joint capsule with interrupted #1 Ethibond suture followed by 0 Vicryl deep tissue, 2-0 Vicryl subcutaneous tissue, 4-0 Monocryl subcuticular stitch, and Steri-Strips on the skin.  Well-padded dressing was applied.  He was taken to the recovery room in stable condition. All final counts were correct.  There were no complications noted.  Of note, Juan CanalGilbert Clark, PA-C, assisted in the entire case.  His assistance was crucial for facilitating all aspects of this case.     Juan Pandahristopher Y. Juan IvanBlackman, M.D.     CYB/MEDQ  D:  07/26/2015  T:  07/27/2015  Job:  [id number]

## 2015-07-27 NOTE — Progress Notes (Signed)
Physical Therapy Treatment Patient Details Name: Juan MarionGary R Lorence MRN: [id number] DOB: 09/08/1945 Today's Date: 07/27/2015    History of Present Illness Pt is a 70 y.o. male now s/p Lt unicompartmental knee arthroplasty. PMH: diabetes.    PT Comments    Pt making progress with mobility during PT sessions. Reinforcing safety throughout session with both patient and spouse. Pt verbalized understanding by end of session. Anticipating pt will D/C to home with spouse's assistance. Both deny questions or concerns following PT session.   Follow Up Recommendations  Home health PT;Supervision for mobility/OOB     Equipment Recommendations  Rolling walker with 5" wheels    Recommendations for Other Services       Precautions / Restrictions Precautions Precautions: Knee;Fall Precaution Booklet Issued: Yes (comment) Precaution Comments: HEP provided, reviewed knee extension precautions Restrictions Weight Bearing Restrictions: Yes LLE Weight Bearing: Weight bearing as tolerated    Mobility  Bed Mobility Overal bed mobility: Needs Assistance Bed Mobility: Supine to Sit;Sit to Supine     Supine to sit: Modified independent (Device/Increase time) Sit to supine: Supervision   General bed mobility comments: Pt using Rt LE to assist Lt.   Transfers Overall transfer level: Needs assistance Equipment used: Rolling walker (2 wheeled) Transfers: Sit to/from Stand Sit to Stand: Supervision         General transfer comment: Repeating transfers with emphasis on safety. Reminder for hand placement.   Ambulation/Gait Ambulation/Gait assistance: Min guard;Supervision Ambulation Distance (Feet): 200 Feet Assistive device: Rolling walker (2 wheeled) Gait Pattern/deviations: Step-through pattern;Decreased step length - right;Decreased weight shift to left Gait velocity: variable, fast then slow.    General Gait Details: cues for even strides and cadence. Pt did attempt to take a step without  rw, knee slightly buckling but pt able to catch himself. Stressed need to use rw for safety, pt verbalizes understanding.    Stairs Stairs: Yes Stairs assistance: Min guard Stair Management: One rail Right;Forwards;Step to pattern Number of Stairs: 5 General stair comments: cues for sequence and reminders as needed. Spouse observing throughout. both deny questions or concerns.   Wheelchair Mobility    Modified Rankin (Stroke Patients Only)       Balance Overall balance assessment: Needs assistance Sitting-balance support: No upper extremity supported Sitting balance-Leahy Scale: Good     Standing balance support: During functional activity Standing balance-Leahy Scale: Fair Standing balance comment: using rw with ambulation                    Cognition Arousal/Alertness: Awake/alert Behavior During Therapy: WFL for tasks assessed/performed Overall Cognitive Status: Within Functional Limits for tasks assessed                      Exercises Total Joint Exercises Ankle Circles/Pumps: AROM;Both;10 reps Quad Sets: Strengthening;Left;10 reps Short Arc Quad: Strengthening;Left;10 reps Heel Slides: AAROM;Left;10 reps Hip ABduction/ADduction: Strengthening;Left;10 reps Straight Leg Raises: Strengthening;Left;10 reps Long Arc Quad: Strengthening;Left;10 reps Knee Flexion: AROM;Left;10 reps Goniometric ROM: 12-82 degrees    General Comments        Pertinent Vitals/Pain Pain Assessment: 0-10 Pain Score: 2  Pain Location: Lt knee Pain Descriptors / Indicators: Sore Pain Intervention(s): Limited activity within patient's tolerance;Monitored during session    Home Living        Prior Function          PT Goals (current goals can now be found in the care plan section) Acute Rehab PT Goals Patient Stated Goal: get back  to hiking and golfing PT Goal Formulation: With patient Time For Goal Achievement: 08/10/15 Potential to Achieve Goals:  Good Progress towards PT goals: Progressing toward goals    Frequency  7X/week    PT Plan Current plan remains appropriate    Co-evaluation             End of Session Equipment Utilized During Treatment: Gait belt Activity Tolerance: Patient tolerated treatment well Patient left: in bed;with call bell/phone within reach;with family/visitor present (in knee extension)     Time: 1610-9604 PT Time Calculation (min) (ACUTE ONLY): 29 min  Charges:  $Gait Training: 8-22 mins $Therapeutic Exercise: 8-22 mins                    G Codes:   Christiane Ha, PT, CSCS Pager [phone] Office [phone]  07/27/2015, 3:34 PM

## 2015-07-27 NOTE — Discharge Instructions (Signed)
INSTRUCTIONS AFTER JOINT REPLACEMENT  ° °o Remove items at home which could result in a fall. This includes throw rugs or furniture in walking pathways °o ICE to the affected joint every three hours while awake for 30 minutes at a time, for at least the first 3-5 days, and then as needed for pain and swelling.  Continue to use ice for pain and swelling. You may notice swelling that will progress down to the foot and ankle.  This is normal after surgery.  Elevate your leg when you are not up walking on it.   °o Continue to use the breathing machine you got in the hospital (incentive spirometer) which will help keep your temperature down.  It is common for your temperature to cycle up and down following surgery, especially at night when you are not up moving around and exerting yourself.  The breathing machine keeps your lungs expanded and your temperature down. ° ° °DIET:  As you were doing prior to hospitalization, we recommend a well-balanced diet. ° °DRESSING / WOUND CARE / SHOWERING ° °Keep the surgical dressing until follow up.  The dressing is water proof, so you can shower without any extra covering.  IF THE DRESSING FALLS OFF or the wound gets wet inside, change the dressing with sterile gauze.  Please use good hand washing techniques before changing the dressing.  Do not use any lotions or creams on the incision until instructed by your surgeon.   ° °ACTIVITY ° °o Increase activity slowly as tolerated, but follow the weight bearing instructions below.   °o No driving for 6 weeks or until further direction given by your physician.  You cannot drive while taking narcotics.  °o No lifting or carrying greater than 10 lbs. until further directed by your surgeon. °o Avoid periods of inactivity such as sitting longer than an hour when not asleep. This helps prevent blood clots.  °o You may return to work once you are authorized by your doctor.  ° ° ° °WEIGHT BEARING  ° °Weight bearing as tolerated with assist  device (walker, cane, etc) as directed, use it as long as suggested by your surgeon or therapist, typically at least 4-6 weeks. ° ° °EXERCISES ° °Results after joint replacement surgery are often greatly improved when you follow the exercise, range of motion and muscle strengthening exercises prescribed by your doctor. Safety measures are also important to protect the joint from further injury. Any time any of these exercises cause you to have increased pain or swelling, decrease what you are doing until you are comfortable again and then slowly increase them. If you have problems or questions, call your caregiver or physical therapist for advice.  ° °Rehabilitation is important following a joint replacement. After just a few days of immobilization, the muscles of the leg can become weakened and shrink (atrophy).  These exercises are designed to build up the tone and strength of the thigh and leg muscles and to improve motion. Often times heat used for twenty to thirty minutes before working out will loosen up your tissues and help with improving the range of motion but do not use heat for the first two weeks following surgery (sometimes heat can increase post-operative swelling).  ° °These exercises can be done on a training (exercise) mat, on the floor, on a table or on a bed. Use whatever works the best and is most comfortable for you.    Use music or television while you are exercising so that   the exercises are a pleasant break in your day. This will make your life better with the exercises acting as a break in your routine that you can look forward to.   Perform all exercises about fifteen times, three times per day or as directed.  You should exercise both the operative leg and the other leg as well. ° °Exercises include: °  °• Quad Sets - Tighten up the muscle on the front of the thigh (Quad) and hold for 5-10 seconds.   °• Straight Leg Raises - With your knee straight (if you were given a brace, keep it on),  lift the leg to 60 degrees, hold for 3 seconds, and slowly lower the leg.  Perform this exercise against resistance later as your leg gets stronger.  °• Leg Slides: Lying on your back, slowly slide your foot toward your buttocks, bending your knee up off the floor (only go as far as is comfortable). Then slowly slide your foot back down until your leg is flat on the floor again.  °• Angel Wings: Lying on your back spread your legs to the side as far apart as you can without causing discomfort.  °• Hamstring Strength:  Lying on your back, push your heel against the floor with your leg straight by tightening up the muscles of your buttocks.  Repeat, but this time bend your knee to a comfortable angle, and push your heel against the floor.  You may put a pillow under the heel to make it more comfortable if necessary.  ° °A rehabilitation program following joint replacement surgery can speed recovery and prevent re-injury in the future due to weakened muscles. Contact your doctor or a physical therapist for more information on knee rehabilitation.  ° ° °CONSTIPATION ° °Constipation is defined medically as fewer than three stools per week and severe constipation as less than one stool per week.  Even if you have a regular bowel pattern at home, your normal regimen is likely to be disrupted due to multiple reasons following surgery.  Combination of anesthesia, postoperative narcotics, change in appetite and fluid intake all can affect your bowels.  ° °YOU MUST use at least one of the following options; they are listed in order of increasing strength to get the job done.  They are all available over the counter, and you may need to use some, POSSIBLY even all of these options:   ° °Drink plenty of fluids (prune juice may be helpful) and high fiber foods °Colace 100 mg by mouth twice a day  °Senokot for constipation as directed and as needed Dulcolax (bisacodyl), take with full glass of water  °Miralax (polyethylene glycol)  once or twice a day as needed. ° °If you have tried all these things and are unable to have a bowel movement in the first 3-4 days after surgery call either your surgeon or your primary doctor.   ° °If you experience loose stools or diarrhea, hold the medications until you stool forms back up.  If your symptoms do not get better within 1 week or if they get worse, check with your doctor.  If you experience "the worst abdominal pain ever" or develop nausea or vomiting, please contact the office immediately for further recommendations for treatment. ° ° °ITCHING:  If you experience itching with your medications, try taking only a single pain pill, or even half a pain pill at a time.  You can also use Benadryl over the counter for itching or also to   help with sleep.   TED HOSE STOCKINGS:  Use stockings on both legs until for at least 2 weeks or as directed by physician office. They may be removed at night for sleeping.  MEDICATIONS:  See your medication summary on the After Visit Summary that nursing will review with you.  You may have some home medications which will be placed on hold until you complete the course of blood thinner medication.  It is important for you to complete the blood thinner medication as prescribed.  PRECAUTIONS:  If you experience chest pain or shortness of breath - call 911 immediately for transfer to the hospital emergency department.   If you develop a fever greater that 101 F, purulent drainage from wound, increased redness or drainage from wound, foul odor from the wound/dressing, or calf pain - CONTACT YOUR SURGEON.                                                   FOLLOW-UP APPOINTMENTS:  If you do not already have a post-op appointment, please call the office for an appointment to be seen by your surgeon.  Guidelines for how soon to be seen are listed in your After Visit Summary, but are typically between 1-4 weeks after surgery.  OTHER INSTRUCTIONS:   Knee  Replacement:  Do not place pillow under knee, focus on keeping the knee straight while resting. CPM instructions: 0-90 degrees, 2 hours in the morning, 2 hours in the afternoon, and 2 hours in the evening. Place foam block, curve side up under heel at all times except when in CPM or when walking.  DO NOT modify, tear, cut, or change the foam block in any way.  MAKE SURE YOU:   Understand these instructions.   Get help right away if you are not doing well or get worse.    Thank you for letting us be a part of your medical care team.  It is a privilege we respect greatly.  We hope these instructions will help you stay on track for a fast and full recovery!   Information on my medicine - XARELTO (Rivaroxaban)  This medication education was reviewed with me or my healthcare representative as part of my discharge preparation.  The pharmacist that spoke with me during my hospital stay was:  Marquita PalmsBall, Johniya Durfee M, [facility]  Why was Xarelto prescribed for you? Xarelto was prescribed for you to reduce the risk of blood clots forming after orthopedic surgery. The medical term for these abnormal blood clots is venous thromboembolism (VTE).  What do you need to know about xarelto ? Take your Xarelto ONCE DAILY at the same time every day. You may take it either with or without food.  If you have difficulty swallowing the tablet whole, you may crush it and mix in applesauce just prior to taking your dose.  Take Xarelto exactly as prescribed by your doctor and DO NOT stop taking Xarelto without talking to the doctor who prescribed the medication.  Stopping without other VTE prevention medication to take the place of Xarelto may increase your risk of developing a clot.  After discharge, you should have regular check-up appointments with your healthcare provider that is prescribing your Xarelto.    What do you do if you miss a dose? If you miss a dose, take it as soon as you  remember on the same day then  continue your regularly scheduled once daily regimen the next day. Do not take two doses of Xarelto® on the same day.  ° °Important Safety Information °A possible side effect of Xarelto® is bleeding. You should call your healthcare provider right away if you experience any of the following: °? Bleeding from an injury or your nose that does not stop. °? Unusual colored urine (red or dark brown) or unusual colored stools (red or black). °? Unusual bruising for unknown reasons. °? A serious fall or if you hit your head (even if there is no bleeding). ° °Some medicines may interact with Xarelto® and might increase your risk of bleeding while on Xarelto®. To help avoid this, consult your healthcare provider or pharmacist prior to using any new prescription or non-prescription medications, including herbals, vitamins, non-steroidal anti-inflammatory drugs (NSAIDs) and supplements. ° °This website has more information on Xarelto®: www.xarelto.com. ° ° ° ° °

## 2015-07-27 NOTE — Progress Notes (Signed)
Discharge instructions reviewed with patient and his spouse.  Foley care reviewed with patient and spouse, questions answered and verbalized understanding.

## 2015-07-27 NOTE — Care Management Note (Signed)
Case Management Note  Patient Details  Name: Juan MarionGary R Budnick MRN: [id number] Date of Birth: 04/13/1945  Subjective/Objective:     70 yr old gentleman s/p left unicompartmental knee arthroplasty..               Action/Plan: Case manager spoke with patient at the bedside concerning home health and DME needs at discharge. Choice was offered. Referral was called  To Judeth CornfieldStephanie, [facility]. Patient states his wife will assist him at discharge.    Expected Discharge Date:   07/27/15               Expected Discharge Plan:  Home w Home Health Services  In-House Referral:     Discharge planning Services  CM Consult  Post Acute Care Choice:  Durable Medical Equipment, Home Health Choice offered to:  Patient  DME Arranged:  [facility] rolling DME Agency:  [facility]  [facility]  Status of Service:  Completed, signed off  Medicare Important Message Given:    Date Medicare IM Given:    Medicare IM give by:    Date Additional Medicare IM Given:    Additional Medicare Important Message give by:     If discussed at Long Length of Stay Meetings, dates discussed:    Additional Comments:  Durenda GuthrieBrady, Taffy Delconte Naomi, RN 07/27/2015, 11:35 AM

## 2015-07-27 NOTE — Progress Notes (Signed)
Patient to be discharged home today after foley place for urinary retention. I have personally spoken with [facility]) about patient's need for follow up with a urologist. Garfield CorneaGwyn from [facility] will call patient with an appointment . If Mr. Juan Citronettit does not hear from [facility] within the next 2 days he is to call their office,  I have addressed this with the patient personally. He also knows he can call our office if he has problems getting an appointment with urology. Will discharge home on Flomax.

## 2015-07-28 ENCOUNTER — Encounter (HOSPITAL_COMMUNITY): Payer: Self-pay | Admitting: Orthopaedic Surgery

## 2015-07-28 DIAGNOSIS — Z7984 Long term (current) use of oral hypoglycemic drugs: Secondary | ICD-10-CM | POA: Diagnosis not present

## 2015-07-28 DIAGNOSIS — E7800 Pure hypercholesterolemia, unspecified: Secondary | ICD-10-CM | POA: Diagnosis not present

## 2015-07-28 DIAGNOSIS — Z96652 Presence of left artificial knee joint: Secondary | ICD-10-CM | POA: Diagnosis not present

## 2015-07-28 DIAGNOSIS — Z471 Aftercare following joint replacement surgery: Secondary | ICD-10-CM | POA: Diagnosis not present

## 2015-07-28 DIAGNOSIS — Z7901 Long term (current) use of anticoagulants: Secondary | ICD-10-CM | POA: Diagnosis not present

## 2015-07-28 DIAGNOSIS — E119 Type 2 diabetes mellitus without complications: Secondary | ICD-10-CM | POA: Diagnosis not present

## 2015-07-28 DIAGNOSIS — H3530 Unspecified macular degeneration: Secondary | ICD-10-CM | POA: Diagnosis not present

## 2015-07-28 DIAGNOSIS — R339 Retention of urine, unspecified: Secondary | ICD-10-CM | POA: Diagnosis not present

## 2015-07-29 ENCOUNTER — Encounter: Payer: Self-pay | Admitting: Endocrinology

## 2015-07-29 ENCOUNTER — Other Ambulatory Visit: Payer: Self-pay

## 2015-07-29 MED ORDER — DULAGLUTIDE 0.75 MG/0.5ML ~~LOC~~ SOAJ: SUBCUTANEOUS | Status: DC

## 2015-07-29 MED ORDER — ACCU-CHEK NANO SMARTVIEW W/DEVICE KIT: PACK | Status: DC

## 2015-07-30 DIAGNOSIS — E119 Type 2 diabetes mellitus without complications: Secondary | ICD-10-CM | POA: Diagnosis not present

## 2015-07-30 DIAGNOSIS — H3530 Unspecified macular degeneration: Secondary | ICD-10-CM | POA: Diagnosis not present

## 2015-07-30 DIAGNOSIS — E7800 Pure hypercholesterolemia, unspecified: Secondary | ICD-10-CM | POA: Diagnosis not present

## 2015-07-30 DIAGNOSIS — R339 Retention of urine, unspecified: Secondary | ICD-10-CM | POA: Diagnosis not present

## 2015-07-30 DIAGNOSIS — Z7901 Long term (current) use of anticoagulants: Secondary | ICD-10-CM | POA: Diagnosis not present

## 2015-07-30 DIAGNOSIS — Z96652 Presence of left artificial knee joint: Secondary | ICD-10-CM | POA: Diagnosis not present

## 2015-07-30 DIAGNOSIS — Z471 Aftercare following joint replacement surgery: Secondary | ICD-10-CM | POA: Diagnosis not present

## 2015-07-30 DIAGNOSIS — Z7984 Long term (current) use of oral hypoglycemic drugs: Secondary | ICD-10-CM | POA: Diagnosis not present

## 2015-08-01 ENCOUNTER — Other Ambulatory Visit: Payer: Self-pay | Admitting: Endocrinology

## 2015-08-01 ENCOUNTER — Other Ambulatory Visit: Payer: Self-pay

## 2015-08-01 DIAGNOSIS — Z125 Encounter for screening for malignant neoplasm of prostate: Secondary | ICD-10-CM | POA: Diagnosis not present

## 2015-08-01 DIAGNOSIS — E119 Type 2 diabetes mellitus without complications: Secondary | ICD-10-CM | POA: Diagnosis not present

## 2015-08-01 DIAGNOSIS — Z471 Aftercare following joint replacement surgery: Secondary | ICD-10-CM | POA: Diagnosis not present

## 2015-08-01 DIAGNOSIS — Z7901 Long term (current) use of anticoagulants: Secondary | ICD-10-CM | POA: Diagnosis not present

## 2015-08-01 DIAGNOSIS — N5201 Erectile dysfunction due to arterial insufficiency: Secondary | ICD-10-CM | POA: Diagnosis not present

## 2015-08-01 DIAGNOSIS — R338 Other retention of urine: Secondary | ICD-10-CM | POA: Diagnosis not present

## 2015-08-01 DIAGNOSIS — E7800 Pure hypercholesterolemia, unspecified: Secondary | ICD-10-CM | POA: Diagnosis not present

## 2015-08-01 DIAGNOSIS — Z96652 Presence of left artificial knee joint: Secondary | ICD-10-CM | POA: Diagnosis not present

## 2015-08-01 DIAGNOSIS — Z7984 Long term (current) use of oral hypoglycemic drugs: Secondary | ICD-10-CM | POA: Diagnosis not present

## 2015-08-01 DIAGNOSIS — H3530 Unspecified macular degeneration: Secondary | ICD-10-CM | POA: Diagnosis not present

## 2015-08-01 DIAGNOSIS — R339 Retention of urine, unspecified: Secondary | ICD-10-CM | POA: Diagnosis not present

## 2015-08-02 ENCOUNTER — Other Ambulatory Visit: Payer: Self-pay | Admitting: *Deleted

## 2015-08-03 ENCOUNTER — Other Ambulatory Visit: Payer: Self-pay | Admitting: Endocrinology

## 2015-08-03 DIAGNOSIS — Z7901 Long term (current) use of anticoagulants: Secondary | ICD-10-CM | POA: Diagnosis not present

## 2015-08-03 DIAGNOSIS — H3530 Unspecified macular degeneration: Secondary | ICD-10-CM | POA: Diagnosis not present

## 2015-08-03 DIAGNOSIS — E7800 Pure hypercholesterolemia, unspecified: Secondary | ICD-10-CM | POA: Diagnosis not present

## 2015-08-03 DIAGNOSIS — E119 Type 2 diabetes mellitus without complications: Secondary | ICD-10-CM | POA: Diagnosis not present

## 2015-08-03 DIAGNOSIS — R339 Retention of urine, unspecified: Secondary | ICD-10-CM | POA: Diagnosis not present

## 2015-08-03 DIAGNOSIS — Z96652 Presence of left artificial knee joint: Secondary | ICD-10-CM | POA: Diagnosis not present

## 2015-08-03 DIAGNOSIS — Z7984 Long term (current) use of oral hypoglycemic drugs: Secondary | ICD-10-CM | POA: Diagnosis not present

## 2015-08-03 DIAGNOSIS — Z471 Aftercare following joint replacement surgery: Secondary | ICD-10-CM | POA: Diagnosis not present

## 2015-08-04 ENCOUNTER — Telehealth: Payer: Self-pay | Admitting: Endocrinology

## 2015-08-04 NOTE — Telephone Encounter (Signed)
[employer] called about diabetic supplies, they said they never received the request that was faxed back CB# [phone] Fax# [phone]

## 2015-08-04 NOTE — Telephone Encounter (Signed)
Refaxed to [facility] [employer]

## 2015-08-05 DIAGNOSIS — R339 Retention of urine, unspecified: Secondary | ICD-10-CM | POA: Diagnosis not present

## 2015-08-05 DIAGNOSIS — E7800 Pure hypercholesterolemia, unspecified: Secondary | ICD-10-CM | POA: Diagnosis not present

## 2015-08-05 DIAGNOSIS — H3530 Unspecified macular degeneration: Secondary | ICD-10-CM | POA: Diagnosis not present

## 2015-08-05 DIAGNOSIS — Z96652 Presence of left artificial knee joint: Secondary | ICD-10-CM | POA: Diagnosis not present

## 2015-08-05 DIAGNOSIS — Z471 Aftercare following joint replacement surgery: Secondary | ICD-10-CM | POA: Diagnosis not present

## 2015-08-05 DIAGNOSIS — E119 Type 2 diabetes mellitus without complications: Secondary | ICD-10-CM | POA: Diagnosis not present

## 2015-08-05 DIAGNOSIS — Z7984 Long term (current) use of oral hypoglycemic drugs: Secondary | ICD-10-CM | POA: Diagnosis not present

## 2015-08-05 DIAGNOSIS — Z7901 Long term (current) use of anticoagulants: Secondary | ICD-10-CM | POA: Diagnosis not present

## 2015-08-08 DIAGNOSIS — M1712 Unilateral primary osteoarthritis, left knee: Secondary | ICD-10-CM | POA: Diagnosis not present

## 2015-09-05 ENCOUNTER — Encounter (HOSPITAL_COMMUNITY): Payer: Self-pay | Admitting: Orthopaedic Surgery

## 2015-10-10 ENCOUNTER — Other Ambulatory Visit (INDEPENDENT_AMBULATORY_CARE_PROVIDER_SITE_OTHER): Payer: 59

## 2015-10-10 DIAGNOSIS — E1165 Type 2 diabetes mellitus with hyperglycemia: Secondary | ICD-10-CM

## 2015-10-10 LAB — BASIC METABOLIC PANEL
BUN: 26 mg/dL — AB (ref 6–23)
CHLORIDE: 104 meq/L (ref 96–112)
CO2: 28 mEq/L (ref 19–32)
Calcium: 9.8 mg/dL (ref 8.4–10.5)
Creatinine, Ser: 1.01 mg/dL (ref 0.40–1.50)
GFR: 77.54 mL/min (ref >60.00)
GLUCOSE: 152 mg/dL — AB (ref 70–99)
POTASSIUM: 3.9 meq/L (ref 3.5–5.1)
SODIUM: 140 meq/L (ref 135–145)

## 2015-10-10 LAB — HEMOGLOBIN A1C: Hgb A1c MFr Bld: 6.8 % — ABNORMAL HIGH (ref 4.6–6.5)

## 2015-10-13 ENCOUNTER — Ambulatory Visit: Payer: 59 | Admitting: Endocrinology

## 2015-10-20 DIAGNOSIS — L820 Inflamed seborrheic keratosis: Secondary | ICD-10-CM | POA: Diagnosis not present

## 2015-10-20 DIAGNOSIS — L578 Other skin changes due to chronic exposure to nonionizing radiation: Secondary | ICD-10-CM | POA: Diagnosis not present

## 2015-10-20 DIAGNOSIS — L570 Actinic keratosis: Secondary | ICD-10-CM | POA: Diagnosis not present

## 2015-10-20 DIAGNOSIS — L821 Other seborrheic keratosis: Secondary | ICD-10-CM | POA: Diagnosis not present

## 2015-10-20 DIAGNOSIS — L812 Freckles: Secondary | ICD-10-CM | POA: Diagnosis not present

## 2015-10-24 ENCOUNTER — Encounter: Payer: Self-pay | Admitting: Endocrinology

## 2015-10-24 ENCOUNTER — Ambulatory Visit (INDEPENDENT_AMBULATORY_CARE_PROVIDER_SITE_OTHER): Payer: 59 | Admitting: Endocrinology

## 2015-10-24 VITALS — BP 126/80 | HR 80 | Ht 72.0 in | Wt 178.0 lb

## 2015-10-24 DIAGNOSIS — E119 Type 2 diabetes mellitus without complications: Secondary | ICD-10-CM | POA: Diagnosis not present

## 2015-10-24 NOTE — Progress Notes (Signed)
Patient ID: Juan Morales, male   DOB: Oct 22, 1945, 70 y.o.   MRN: [id number]   Reason for Appointment:  follow-up   History of Present Illness   Diagnosis: Type 2 DIABETES MELITUS  He was initially diagnosed with prediabetes in 2003. Previously had relatively mild diabetes which was previously well-controlled with low-dose Jentadueto or Janumet XR His A1c had been usually under 7%   When his A1c had gone up to 7.6% in 7/15 he was started on Trulicity  6.46 mg weekly.   With this his blood sugars improved somewhat, he was also recommended 1.5 mg dosage but he did not want to do this He also continues to take 1500 mg of metformin without any side effects  A1c is now again below 7%, lowest has been 6.6 previously  Current blood sugar patterns and problems identified:  Since his last visit he has started by walking after he had his knee surgery in May and is starting to lose weight  He thinks he is trying to watch his diet better and cutting back some on desserts and portions, he thinks he does not get as hungry  Not clear how often he is monitoring his blood sugars  Did not bring his monitor for download today but he does not think his blood sugars are usually high   However his lab glucose was 152 fasting  Side effects from medications:  none  Monitors blood glucose: Once a day or less.    Glucometer: Accucheck        Blood Glucose readings by recall:  FASTING range 115, after meals as high as 165 but not consistently  Physical activity: exercise: Walking recently 25 miles a week   Wt Readings from Last 3 Encounters:  10/24/15 178 lb (80.7 kg)  07/26/15 182 lb (82.6 kg)  07/18/15 182 lb 1.6 oz (82.6 kg)   Lab Results  Component Value Date   HGBA1C 6.8 (H) 10/10/2015   HGBA1C 6.9 (H) 07/04/2015   HGBA1C 6.6 (H) 02/22/2015   Lab Results  Component Value Date   MICROALBUR <0.7 02/22/2015   LDLCALC 100 (H) 07/04/2015   CREATININE 1.01 10/10/2015    No  visits with results within 1 Week(s) from this visit.  Latest known visit with results is:  Lab on 10/10/2015  Component Date Value Ref Range Status  . Hgb A1c MFr Bld 10/10/2015 6.8* 4.6 - 6.5 % Final  . Sodium 10/10/2015 140  135 - 145 mEq/L Final  . Potassium 10/10/2015 3.9  3.5 - 5.1 mEq/L Final  . Chloride 10/10/2015 104  96 - 112 mEq/L Final  . CO2 10/10/2015 28  19 - 32 mEq/L Final  . Glucose, Bld 10/10/2015 152* 70 - 99 mg/dL Final  . BUN 10/10/2015 26* 6 - 23 mg/dL Final  . Creatinine, Ser 10/10/2015 1.01  0.40 - 1.50 mg/dL Final  . Calcium 10/10/2015 9.8  8.4 - 10.5 mg/dL Final  . GFR 10/10/2015 77.54  >60.00 mL/min Final      Medication List       Accurate as of 10/24/15  3:16 PM. Always use your most recent med list.          ACCU-CHEK NANO SMARTVIEW w/Device Kit Use to check blood sugar once a day dx code E11.65   ACCU-CHEK SMARTVIEW test strip Generic drug:  glucose blood USE AS DIRECTED  TO CHECK BLOOD SUGAR ONE TIME DAILY   calcium carbonate 1250 (500 Ca) MG tablet Commonly known as:  OS-CAL - dosed in mg of elemental calcium Take 1 tablet by mouth daily.   Chromium 1000 MCG Tabs Take 1 tablet by mouth daily.   Fish Oil 1000 MG Caps Take 1 capsule by mouth daily.   metFORMIN 750 MG 24 hr tablet Commonly known as:  GLUCOPHAGE XR Take 2 tablets daily   methocarbamol 500 MG tablet Commonly known as:  ROBAXIN Take 1 tablet (500 mg total) by mouth every 6 (six) hours as needed for muscle spasms.   multivitamin with minerals tablet Take 1 tablet by mouth daily.   oxyCODONE-acetaminophen 5-325 MG tablet Commonly known as:  ROXICET Take 1-2 tablets by mouth every 4 (four) hours as needed.   pravastatin 40 MG tablet Commonly known as:  PRAVACHOL Take 1 tablet (40 mg total) by mouth daily.   rivaroxaban 10 MG Tabs tablet Commonly known as:  XARELTO Take 1 tablet (10 mg total) by mouth daily with breakfast.   sildenafil 100 MG tablet Commonly  known as:  VIAGRA Take 1 tablet (100 mg total) by mouth daily as needed for erectile dysfunction.   tamsulosin 0.4 MG Caps capsule Commonly known as:  FLOMAX Take 1 capsule (0.4 mg total) by mouth daily.   TRULICITY 3.87 FI/4.3PI Sopn Generic drug:  Dulaglutide INJECT (0.'75MG'$ ) SUBCUTANEOUSLY ONCE A WEEK   zinc gluconate 50 MG tablet Take 50 mg by mouth daily.       Allergies: No Known Allergies  Past Medical History:  Diagnosis Date  . Arthritis   . Diabetes mellitus without complication ([place])   . Erectile dysfunction   . Hyperlipidemia   . Macular degeneration     Past Surgical History:  Procedure Laterality Date  . arthroscopic knee Left    times 2  . EYE SURGERY Bilateral    cataract removal  . PARTIAL KNEE ARTHROPLASTY Left 07/26/2015   Procedure: LEFT UNICOMPARTMENTAL KNEE ARTHROPLASTY;  Surgeon: Mcarthur Rossetti, MD;  Location: [place];  Service: Orthopedics;  Laterality: Left;    Family History  Problem Relation Age of Onset  . Diabetes Neg Hx   . Heart disease Neg Hx   . Stroke Neg Hx   . Hypertension Father     Social History:  reports that he has never smoked. He does not have any smokeless tobacco history on file. He reports that he does not drink alcohol or use drugs.  Review of Systems:  Has macular degeneration, hAs regular exams  No history of hypertension  but his blood pressure has been high normal Periodically Previously with low-dose ramipril he tended to have low blood pressure readings Occasionally checking blood pressure at home and he thinks it is around 78 diastolic  HYPERLIPIDEMIA: The lipid abnormality consists of elevated LDL. He has continued to take pravastatin and his LDL is around 100    Lab Results  Component Value Date   CHOL 170 07/04/2015   HDL 54.80 07/04/2015   LDLCALC 100 (H) 07/04/2015   LDLDIRECT 144.3 02/16/2014   TRIG 80.0 07/04/2015   CHOLHDL 3 07/04/2015    He has had long-standing erectile dysfunction,   has been prescribed Viagra with good results      Examination:   BP 128/86 (BP Location: Left Arm, Patient Position: Sitting, Cuff Size: Normal)   Pulse 80   Ht 6' (1.829 m)   Wt 178 lb (80.7 kg)   SpO2 98%   BMI 24.14 kg/m   Body mass index is 24.14 kg/m.   Repeat blood pressure was  124/80  ASSESSMENT/ PLAN:   Diabetes type 2 with BMI 25 See history of present illness for detailed discussion of current diabetes management, blood sugar patterns and problems identified  The patient's diabetes control appears to be fair although A1c Is upper normal at 6.8 Lab glucose 152 fasting but he thinks his blood sugars are better at home, however again does not bring his monitor for download for confirmation of blood sugars and frequency of monitoring  He is starting to lose a little weight since his last visit with getting back into walking over the last few weeks He thinks he is appetite is controlled with Trulicity 3.24 weekly  Since fasting readings may be relatively high he can take both his metformin ER dinnertime  LIPIDS: Will need to follow-up on the next visit  Blood pressure is normal today, continue monitoring at home  Encouraged him to follow-up with her PCP for general care   [facility] 10/24/2015, 3:16 PM

## 2015-12-02 DIAGNOSIS — H353121 Nonexudative age-related macular degeneration, left eye, early dry stage: Secondary | ICD-10-CM | POA: Diagnosis not present

## 2015-12-02 DIAGNOSIS — H43813 Vitreous degeneration, bilateral: Secondary | ICD-10-CM | POA: Diagnosis not present

## 2015-12-02 DIAGNOSIS — H35372 Puckering of macula, left eye: Secondary | ICD-10-CM | POA: Diagnosis not present

## 2015-12-02 DIAGNOSIS — H353212 Exudative age-related macular degeneration, right eye, with inactive choroidal neovascularization: Secondary | ICD-10-CM | POA: Diagnosis not present

## 2015-12-19 DIAGNOSIS — M1712 Unilateral primary osteoarthritis, left knee: Secondary | ICD-10-CM | POA: Diagnosis not present

## 2015-12-19 DIAGNOSIS — M25562 Pain in left knee: Secondary | ICD-10-CM | POA: Diagnosis not present

## 2015-12-27 ENCOUNTER — Encounter: Payer: Self-pay | Admitting: Endocrinology

## 2016-01-04 ENCOUNTER — Other Ambulatory Visit: Payer: Self-pay | Admitting: *Deleted

## 2016-01-04 ENCOUNTER — Encounter: Payer: Self-pay | Admitting: Endocrinology

## 2016-01-04 MED ORDER — METFORMIN HCL ER 750 MG PO TB24: 750.0000 mg | ORAL_TABLET | Freq: Two times a day (BID) | ORAL | 1 refills | Status: DC

## 2016-01-04 MED ORDER — PRAVASTATIN SODIUM 40 MG PO TABS: 40.0000 mg | ORAL_TABLET | Freq: Every day | ORAL | 1 refills | Status: DC

## 2016-01-04 MED ORDER — DULAGLUTIDE 0.75 MG/0.5ML ~~LOC~~ SOAJ: SUBCUTANEOUS | 1 refills | Status: DC

## 2016-01-11 ENCOUNTER — Other Ambulatory Visit: Payer: Self-pay | Admitting: *Deleted

## 2016-04-23 ENCOUNTER — Telehealth (INDEPENDENT_AMBULATORY_CARE_PROVIDER_SITE_OTHER): Payer: Self-pay | Admitting: Orthopaedic Surgery

## 2016-04-23 ENCOUNTER — Other Ambulatory Visit (INDEPENDENT_AMBULATORY_CARE_PROVIDER_SITE_OTHER): Payer: 59

## 2016-04-23 DIAGNOSIS — E119 Type 2 diabetes mellitus without complications: Secondary | ICD-10-CM | POA: Diagnosis not present

## 2016-04-23 LAB — LIPID PANEL
CHOL/HDL RATIO: 3
CHOLESTEROL: 175 mg/dL (ref 0–200)
HDL: 52.1 mg/dL (ref >39.00)
LDL CALC: 101 mg/dL — AB (ref 0–99)
NonHDL: 122.84
TRIGLYCERIDES: 108 mg/dL (ref 0.0–149.0)
VLDL: 21.6 mg/dL (ref 0.0–40.0)

## 2016-04-23 LAB — COMPREHENSIVE METABOLIC PANEL
ALT: 15 U/L (ref 0–53)
AST: 14 U/L (ref 0–37)
Albumin: 4 g/dL (ref 3.5–5.2)
Alkaline Phosphatase: 45 U/L (ref 39–117)
BILIRUBIN TOTAL: 0.7 mg/dL (ref 0.2–1.2)
BUN: 22 mg/dL (ref 6–23)
CALCIUM: 9.4 mg/dL (ref 8.4–10.5)
CHLORIDE: 104 meq/L (ref 96–112)
CO2: 29 meq/L (ref 19–32)
Creatinine, Ser: 1 mg/dL (ref 0.40–1.50)
GFR: 78.31 mL/min (ref >60.00)
GLUCOSE: 186 mg/dL — AB (ref 70–99)
Potassium: 4.4 mEq/L (ref 3.5–5.1)
Sodium: 140 mEq/L (ref 135–145)
Total Protein: 6.3 g/dL (ref 6.0–8.3)

## 2016-04-23 LAB — MICROALBUMIN / CREATININE URINE RATIO
Creatinine,U: 207.1 mg/dL
Microalb Creat Ratio: 0.6 mg/g (ref 0.0–30.0)
Microalb, Ur: 1.3 mg/dL (ref 0.0–1.9)

## 2016-04-23 LAB — HEMOGLOBIN A1C: HEMOGLOBIN A1C: 7.6 % — AB (ref 4.6–6.5)

## 2016-04-23 NOTE — Telephone Encounter (Signed)
Patient aware he does not need antibiotic.

## 2016-04-23 NOTE — Telephone Encounter (Signed)
Patient called needing to know if he will need a antibotic prior to his dental appointment. Patient advised he cannot get appointment without knowing this information. The number to contact patient is [phone]

## 2016-04-26 ENCOUNTER — Encounter: Payer: Self-pay | Admitting: Endocrinology

## 2016-04-26 ENCOUNTER — Ambulatory Visit (INDEPENDENT_AMBULATORY_CARE_PROVIDER_SITE_OTHER): Payer: Medicare PPO | Admitting: Endocrinology

## 2016-04-26 VITALS — BP 132/74 | HR 81 | Ht 72.0 in | Wt 187.0 lb

## 2016-04-26 DIAGNOSIS — E1165 Type 2 diabetes mellitus with hyperglycemia: Secondary | ICD-10-CM | POA: Diagnosis not present

## 2016-04-26 MED ORDER — GLUCOSE BLOOD VI STRP: ORAL_STRIP | 3 refills | Status: DC

## 2016-04-26 NOTE — Patient Instructions (Addendum)
Trulicity 2 shots at a time and next rx will be 1.5mg   Check blood sugars on waking up  3x weekly  Also check blood sugars about 2 hours after a meal and do this after different meals by rotation  Recommended blood sugar levels on waking up is 90-130 and about 2 hours after meal is 130-160  Please bring your blood sugar monitor to each visit, thank you  Restart exercise

## 2016-04-26 NOTE — Progress Notes (Signed)
Patient ID: Juan Morales, male   DOB: April 13, 1945, 71 y.o.   MRN: [id number]   Reason for Appointment:  follow-up   History of Present Illness   Diagnosis: Type 2 DIABETES MELITUS  He was initially diagnosed with prediabetes in 2003. Previously had relatively mild diabetes which was previously well-controlled with low-dose Jentadueto or Janumet XR His A1c had been usually under 7%   When his A1c had gone up to 7.6% in 7/15 he was started on Trulicity  2.87 mg weekly.   With this his blood sugars improved somewhat, he was also recommended 1.5 mg dosage but he did not want to do this He also continues to take 1500 mg of metformin   A1c is now 7.6, lowest has been 6.6 previously  Current blood sugar patterns and problems identified:  He says he has difficulty getting adequate monitoring on his blood sugar monitor and did not ask for a replacement  He has gained 9 pounds  He has been quite noncompliant with watching his portions and sweets  Also has not been doing any significant exercise for walking which he used to do  He says that he has not been able to keep up with his normal routine because having to travel out of town frequently recently  He thinks his blood sugars are about 130 at home in the morning when he checks them.  However his lab glucose was 16 fasting  Side effects from medications:  none  Monitors blood glucose: Once a day or less.    Glucometer: Accucheck        Blood Glucose readings by recall:   FASTING range 130   Physical activity: exercise: Walking less recently   Wt Readings from Last 3 Encounters:  04/26/16 187 lb (84.8 kg)  10/24/15 178 lb (80.7 kg)  07/26/15 182 lb (82.6 kg)   Lab Results  Component Value Date   HGBA1C 7.6 (H) 04/23/2016   HGBA1C 6.8 (H) 10/10/2015   HGBA1C 6.9 (H) 07/04/2015   Lab Results  Component Value Date   MICROALBUR 1.3 04/23/2016   LDLCALC 101 (H) 04/23/2016   CREATININE 1.00 04/23/2016    OTHER  active problems: See review of systems  Lab on 04/23/2016  Component Date Value Ref Range Status  . Hgb A1c MFr Bld 04/23/2016 7.6* 4.6 - 6.5 % Final  . Sodium 04/23/2016 140  135 - 145 mEq/L Final  . Potassium 04/23/2016 4.4  3.5 - 5.1 mEq/L Final  . Chloride 04/23/2016 104  96 - 112 mEq/L Final  . CO2 04/23/2016 29  19 - 32 mEq/L Final  . Glucose, Bld 04/23/2016 186* 70 - 99 mg/dL Final  . BUN 04/23/2016 22  6 - 23 mg/dL Final  . Creatinine, Ser 04/23/2016 1.00  0.40 - 1.50 mg/dL Final  . Total Bilirubin 04/23/2016 0.7  0.2 - 1.2 mg/dL Final  . Alkaline Phosphatase 04/23/2016 45  39 - 117 U/L Final  . AST 04/23/2016 14  0 - 37 U/L Final  . ALT 04/23/2016 15  0 - 53 U/L Final  . Total Protein 04/23/2016 6.3  6.0 - 8.3 g/dL Final  . Albumin 04/23/2016 4.0  3.5 - 5.2 g/dL Final  . Calcium 04/23/2016 9.4  8.4 - 10.5 mg/dL Final  . GFR 04/23/2016 78.31  >60.00 mL/min Final  . Microalb, Ur 04/23/2016 1.3  0.0 - 1.9 mg/dL Final  . Creatinine,U 04/23/2016 207.1  mg/dL Final  . Microalb Creat Ratio 04/23/2016 0.6  0.0 - 30.0 mg/g Final  . Cholesterol 04/23/2016 175  0 - 200 mg/dL Final  . Triglycerides 04/23/2016 108.0  0.0 - 149.0 mg/dL Final  . HDL 04/23/2016 52.10  >39.00 mg/dL Final  . VLDL 04/23/2016 21.6  0.0 - 40.0 mg/dL Final  . LDL Cholesterol 04/23/2016 101* 0 - 99 mg/dL Final  . Total CHOL/HDL Ratio 04/23/2016 3   Final  . NonHDL 04/23/2016 122.84   Final    Allergies as of 04/26/2016   No Known Allergies     Medication List       Accurate as of 04/26/16 11:59 PM. Always use your most recent med list.          ACCU-CHEK NANO SMARTVIEW w/Device Kit Use to check blood sugar once a day dx code E11.65   calcium carbonate 1250 (500 Ca) MG tablet Commonly known as:  OS-CAL - dosed in mg of elemental calcium Take 1 tablet by mouth daily.   Chromium 1000 MCG Tabs Take 1 tablet by mouth daily.   Dulaglutide 0.75 MG/0.5ML Sopn Commonly known as:  TRULICITY INJECT  (5.83EN) SUBCUTANEOUSLY ONCE A WEEK   Fish Oil 1000 MG Caps Take 1 capsule by mouth daily.   glucose blood test strip Commonly known as:  ACCU-CHEK GUIDE To check blood sugar once a day at various times   metFORMIN 750 MG 24 hr tablet Commonly known as:  GLUCOPHAGE XR Take 1 tablet (750 mg total) by mouth 2 (two) times daily. Take 2 tablets daily   methocarbamol 500 MG tablet Commonly known as:  ROBAXIN Take 1 tablet (500 mg total) by mouth every 6 (six) hours as needed for muscle spasms.   multivitamin with minerals tablet Take 1 tablet by mouth daily.   pravastatin 40 MG tablet Commonly known as:  PRAVACHOL Take 1 tablet (40 mg total) by mouth daily.   sildenafil 100 MG tablet Commonly known as:  VIAGRA Take 1 tablet (100 mg total) by mouth daily as needed for erectile dysfunction.   zinc gluconate 50 MG tablet Take 50 mg by mouth daily.       Allergies: No Known Allergies  Past Medical History:  Diagnosis Date  . Arthritis   . Diabetes mellitus without complication ([place])   . Erectile dysfunction   . Hyperlipidemia   . Macular degeneration     Past Surgical History:  Procedure Laterality Date  . arthroscopic knee Left    times 2  . EYE SURGERY Bilateral    cataract removal  . PARTIAL KNEE ARTHROPLASTY Left 07/26/2015   Procedure: LEFT UNICOMPARTMENTAL KNEE ARTHROPLASTY;  Surgeon: Mcarthur Rossetti, MD;  Location: [place];  Service: Orthopedics;  Laterality: Left;    Family History  Problem Relation Age of Onset  . Diabetes Neg Hx   . Heart disease Neg Hx   . Stroke Neg Hx   . Hypertension Father     Social History:  reports that he has never smoked. He has never used smokeless tobacco. He reports that he does not drink alcohol or use drugs.  Review of Systems:  Has macular degeneration, Generally has regular exams  No history of hypertension  but his blood pressure has been high normal in the office Previously with low-dose ramipril he tended  to have low blood pressure readings Occasionally checking blood pressure at home and he thinks it is still normal  HYPERLIPIDEMIA: The lipid abnormality consists of elevated LDL.  He has continued to take pravastatin and his LDL is  still around 100    Lab Results  Component Value Date   CHOL 175 04/23/2016   HDL 52.10 04/23/2016   LDLCALC 101 (H) 04/23/2016   LDLDIRECT 144.3 02/16/2014   TRIG 108.0 04/23/2016   CHOLHDL 3 04/23/2016    He has had long-standing erectile dysfunction,  has been prescribed Viagra with good results      Examination:   BP 132/74   Pulse 81   Ht 6' (1.829 m)   Wt 187 lb (84.8 kg)   SpO2 96%   BMI 25.36 kg/m   Body mass index is 25.36 kg/m.     Diabetic Foot Exam - Simple   Simple Foot Form Diabetic Foot exam was performed with the following findings:  Yes 04/26/2016  4:27 PM  Visual Inspection No deformities, no ulcerations, no other skin breakdown bilaterally:  Yes Sensation Testing Intact to touch and monofilament testing bilaterally:  Yes Pulse Check Posterior Tibialis and Dorsalis pulse intact bilaterally:  Yes Comments      ASSESSMENT/ PLAN:   Diabetes type 2 with BMI 25 See history of present illness for detailed discussion of current diabetes management, blood sugar patterns and problems identified  The patient's diabetes control appears to be Worse with A1c going up almost 1%, previously 6.8 and now 7.6 Although this may be likely related to his poor diet and lack of adequate exercise especially recently he may be getting some progression of his diabetes. Also is not getting enough satiety was Trulicity 8.97 mg weekly Lab glucose is 186 fasting He has not checked blood sugars much at homeAnd did not ask for a new meter when he had difficulties with doing this testing  Recommendations:  He was given a new Accu-Chek guide meter  Since he has a large supply of the 8.47 Trulicity can take 2 injections of the time  Start  checking blood sugars as directed at various times  Regular walking for exercise  Improve diet  LIPIDS: Hyperlipidemia: On statin drug, LDL around 100    Patient Instructions  Trulicity 2 shots at a time and next rx will be 1.29m  Check blood sugars on waking up  3x weekly  Also check blood sugars about 2 hours after a meal and do this after different meals by rotation  Recommended blood sugar levels on waking up is 90-130 and about 2 hours after meal is 130-160  Please bring your blood sugar monitor to each visit, thank you  Restart exercise      [facility]/04/2016, 3:44 PM

## 2016-05-11 ENCOUNTER — Encounter: Payer: Self-pay | Admitting: Endocrinology

## 2016-05-12 ENCOUNTER — Encounter: Payer: Self-pay | Admitting: Endocrinology

## 2016-05-14 ENCOUNTER — Other Ambulatory Visit: Payer: Self-pay

## 2016-05-14 MED ORDER — GLUCOSE BLOOD VI STRP: ORAL_STRIP | 2 refills | Status: DC

## 2016-05-24 ENCOUNTER — Ambulatory Visit (INDEPENDENT_AMBULATORY_CARE_PROVIDER_SITE_OTHER): Payer: Self-pay | Admitting: Orthopaedic Surgery

## 2016-06-07 ENCOUNTER — Encounter: Payer: Self-pay | Admitting: Endocrinology

## 2016-06-11 ENCOUNTER — Ambulatory Visit (INDEPENDENT_AMBULATORY_CARE_PROVIDER_SITE_OTHER): Payer: Medicare PPO | Admitting: Orthopaedic Surgery

## 2016-06-11 DIAGNOSIS — Z96652 Presence of left artificial knee joint: Secondary | ICD-10-CM

## 2016-06-11 NOTE — Progress Notes (Signed)
The patient is now 11 months status post a left unicompartmental knee replacement. He is doing well and has no problems at all is been playing a lot of golf.  On examination of his left knee there is no effusion at all. His range of motion is full. His knee feels ligamentously stable. There is no significant grind of the knee at all. We did not x-ray her today.  This point he'll continue increase his activities as comfortable. I talked about things need to bring him back specifically for this knee in terms of no aching pains or swelling in the knee that does not seem to go away. Obviously this is a mobile wearing knee and if he has any issues with subluxation of poly-liner component he would absolutely no this and I would need to see him back.

## 2016-06-13 NOTE — Telephone Encounter (Signed)
Refill  Dulaglutide (TRULICITY) 0.75 MG/0.5ML SOPN 12 pen   [facility] [place] - [id number] [phone] (Phone) [phone] (Fax)

## 2016-06-16 ENCOUNTER — Encounter: Payer: Self-pay | Admitting: Endocrinology

## 2016-06-22 ENCOUNTER — Other Ambulatory Visit: Payer: Self-pay | Admitting: Endocrinology

## 2016-07-11 ENCOUNTER — Other Ambulatory Visit: Payer: Self-pay

## 2016-07-24 ENCOUNTER — Other Ambulatory Visit: Payer: 59

## 2016-07-27 ENCOUNTER — Ambulatory Visit: Payer: 59 | Admitting: Endocrinology

## 2016-08-17 ENCOUNTER — Other Ambulatory Visit: Payer: Medicare PPO

## 2016-08-22 ENCOUNTER — Ambulatory Visit: Payer: Medicare PPO | Admitting: Endocrinology

## 2016-09-14 ENCOUNTER — Other Ambulatory Visit: Payer: Medicare PPO

## 2016-09-19 ENCOUNTER — Ambulatory Visit: Payer: Medicare PPO | Admitting: Endocrinology

## 2016-10-22 ENCOUNTER — Other Ambulatory Visit (INDEPENDENT_AMBULATORY_CARE_PROVIDER_SITE_OTHER): Payer: Medicare PPO

## 2016-10-22 DIAGNOSIS — E1165 Type 2 diabetes mellitus with hyperglycemia: Secondary | ICD-10-CM | POA: Diagnosis not present

## 2016-10-22 LAB — BASIC METABOLIC PANEL
BUN: 17 mg/dL (ref 6–23)
CALCIUM: 9.6 mg/dL (ref 8.4–10.5)
CO2: 29 meq/L (ref 19–32)
CREATININE: 0.95 mg/dL (ref 0.40–1.50)
Chloride: 104 mEq/L (ref 96–112)
GFR: 82.97 mL/min (ref >60.00)
Glucose, Bld: 176 mg/dL — ABNORMAL HIGH (ref 70–99)
Potassium: 5.1 mEq/L (ref 3.5–5.1)
Sodium: 140 mEq/L (ref 135–145)

## 2016-10-22 LAB — HEMOGLOBIN A1C: HEMOGLOBIN A1C: 7.6 % — AB (ref 4.6–6.5)

## 2016-10-25 ENCOUNTER — Ambulatory Visit: Payer: Medicare PPO | Admitting: Endocrinology

## 2016-10-31 ENCOUNTER — Ambulatory Visit (INDEPENDENT_AMBULATORY_CARE_PROVIDER_SITE_OTHER): Payer: Medicare PPO | Admitting: Endocrinology

## 2016-10-31 ENCOUNTER — Encounter: Payer: Self-pay | Admitting: Endocrinology

## 2016-10-31 VITALS — BP 130/85 | HR 73 | Ht 72.0 in | Wt 180.2 lb

## 2016-10-31 DIAGNOSIS — E1165 Type 2 diabetes mellitus with hyperglycemia: Secondary | ICD-10-CM

## 2016-10-31 MED ORDER — GLIMEPIRIDE 1 MG PO TABS: 1.0000 mg | ORAL_TABLET | Freq: Every day | ORAL | 3 refills | Status: DC

## 2016-10-31 MED ORDER — SILDENAFIL CITRATE 100 MG PO TABS: 100.0000 mg | ORAL_TABLET | Freq: Every day | ORAL | 0 refills | Status: DC | PRN

## 2016-10-31 NOTE — Patient Instructions (Signed)
Check blood sugars on waking up 3/7   Also check blood sugars about 2 hours after a meal and do this after different meals by rotation  Recommended blood sugar levels on waking up is 90-130 and about 2 hours after meal is 130-160  Please bring your blood sugar monitor to each visit, thank you  

## 2016-10-31 NOTE — Progress Notes (Signed)
Patient ID: Juan Morales, male   DOB: [birth year], 71 y.o.   MRN: [id number]   Reason for Appointment:  follow-up   History of Present Illness   Diagnosis: Type 2 DIABETES MELITUS  He was initially diagnosed with prediabetes in 2003. Previously had relatively mild diabetes which was previously well-controlled with low-dose Jentadueto or Janumet XR His A1c had been usually under 7%   When his A1c had gone up to 7.6% in 7/15 he was started on Trulicity  8.88 mg weekly.  He also continues to take 1500 mg of metformin   A1c is now again 7.6, lowest has been 6.6 previously  Current blood sugar patterns and problems identified:  He says he tried taking the double dose of Trulicity after his last visit but he did not see much difference in his blood sugars, however he is not sure  Has not come back for follow-up as directed also  Although he has lost weight since his last visit his diet apparently has been inconsistent with eating more sweets  Also he thinks he is not walking as much as he normally does, recently walking about 3 times a week  He did not have a battering his meter for the last month and does not know what his blood sugars are recently   Side effects from medications:  none  Monitors blood glucose: Once a day or less.    Glucometer: Accucheck        Blood Glucose readings by recall:   FASTING range 148 today, after meals up to 180, after exercise 103  Physical activity: exercise: Walking less recently   Wt Readings from Last 3 Encounters:  10/31/16 180 lb 3.2 oz (81.7 kg)  04/26/16 187 lb (84.8 kg)  10/24/15 178 lb (80.7 kg)   Lab Results  Component Value Date   HGBA1C 7.6 (H) 10/22/2016   HGBA1C 7.6 (H) 04/23/2016   HGBA1C 6.8 (H) 10/10/2015   Lab Results  Component Value Date   MICROALBUR 1.3 04/23/2016   LDLCALC 101 (H) 04/23/2016   CREATININE 0.95 10/22/2016    OTHER active problems: See review of systems  No visits with results within  1 Week(s) from this visit.  Latest known visit with results is:  Lab on 10/22/2016  Component Date Value Ref Range Status  . Hgb A1c MFr Bld 10/22/2016 7.6* 4.6 - 6.5 % Final   Glycemic Control Guidelines for People with Diabetes:Non Diabetic:  <6%Goal of Therapy: <7%Additional Action Suggested:  >8%   . Sodium 10/22/2016 140  135 - 145 mEq/L Final  . Potassium 10/22/2016 5.1  3.5 - 5.1 mEq/L Final  . Chloride 10/22/2016 104  96 - 112 mEq/L Final  . CO2 10/22/2016 29  19 - 32 mEq/L Final  . Glucose, Bld 10/22/2016 176* 70 - 99 mg/dL Final  . BUN 10/22/2016 17  6 - 23 mg/dL Final  . Creatinine, Ser 10/22/2016 0.95  0.40 - 1.50 mg/dL Final  . Calcium 10/22/2016 9.6  8.4 - 10.5 mg/dL Final  . GFR 10/22/2016 82.97  >60.00 mL/min Final    Allergies as of 10/31/2016   No Known Allergies     Medication List       Accurate as of 10/31/16 10:15 AM. Always use your most recent med list.          ACCU-CHEK NANO SMARTVIEW w/Device Kit Use to check blood sugar once a day dx code E11.65   ACCU-CHEK SMARTVIEW test strip Generic drug:  glucose blood 1 each by Other route 3 (three) times daily. Use as instructed   calcium carbonate 1250 (500 Ca) MG tablet Commonly known as:  OS-CAL - dosed in mg of elemental calcium Take 1 tablet by mouth daily.   Chromium 1000 MCG Tabs Take 1 tablet by mouth daily.   Fish Oil 1000 MG Caps Take 1 capsule by mouth daily.   glimepiride 1 MG tablet Commonly known as:  AMARYL Take 1 tablet (1 mg total) by mouth daily before supper.   metFORMIN 750 MG 24 hr tablet Commonly known as:  GLUCOPHAGE-XR TAKE 1 TABLET TWICE DAILY   multivitamin with minerals tablet Take 1 tablet by mouth daily.   pravastatin 40 MG tablet Commonly known as:  PRAVACHOL TAKE 1 TABLET EVERY DAY   sildenafil 100 MG tablet Commonly known as:  VIAGRA Take 1 tablet (100 mg total) by mouth daily as needed for erectile dysfunction.   TRULICITY 7.32 KG/2.5KY Sopn Generic drug:   Dulaglutide INJECT 0.75 MG (0.5 ML) ONE TIME WEEKLY   zinc gluconate 50 MG tablet Take 50 mg by mouth daily.       Allergies: No Known Allergies  Past Medical History:  Diagnosis Date  . Arthritis   . Diabetes mellitus without complication ([place])   . Erectile dysfunction   . Hyperlipidemia   . Macular degeneration     Past Surgical History:  Procedure Laterality Date  . arthroscopic knee Left    times 2  . EYE SURGERY Bilateral    cataract removal  . PARTIAL KNEE ARTHROPLASTY Left 07/26/2015   Procedure: LEFT UNICOMPARTMENTAL KNEE ARTHROPLASTY;  Surgeon: Mcarthur Rossetti, MD;  Location: [place];  Service: Orthopedics;  Laterality: Left;    Family History  Problem Relation Age of Onset  . Diabetes Neg Hx   . Heart disease Neg Hx   . Stroke Neg Hx   . Hypertension Father     Social History:  reports that he has never smoked. He has never used smokeless tobacco. He reports that he does not drink alcohol or use drugs.  Review of Systems:  Has macular degeneration, has regular exams  No history of hypertension  but his blood pressure has been high normal in the office At times  Previously with low-dose ramipril he tended to have low blood pressure readings Recently he did not checking his blood pressure at home, normally thinks it is fairly good    HYPERLIPIDEMIA: The lipid abnormality consists of elevated LDL.  Has been treated with pravastatin, last LDL 101    Lab Results  Component Value Date   CHOL 175 04/23/2016   HDL 52.10 04/23/2016   LDLCALC 101 (H) 04/23/2016   LDLDIRECT 144.3 02/16/2014   TRIG 108.0 04/23/2016   CHOLHDL 3 04/23/2016    He has had long-standing erectile dysfunction,  has been prescribed Viagra with good results      Examination:   BP 130/85   Pulse 73   Ht 6' (1.829 m)   Wt 180 lb 3.2 oz (81.7 kg)   SpO2 91%   BMI 24.44 kg/m   Body mass index is 24.44 kg/m.      ASSESSMENT/ PLAN:   Diabetes type 2 with BMI 25 See  history of present illness for detailed discussion of current diabetes management, blood sugar patterns and problems identified  His blood sugars are still not well controlled and A1c 7.6 Has not monitored much at home, also not doing consistently well with diet and  exercise His weight is improved, generally does better in summer However his lab glucose fasting was still high at 176  Recommendations:  He was  advised to start checking blood sugars especially after meals  Trial of Amaryl 1 mg at suppertime when discussed that this improves insulin production and may potentially cause low blood sugar  Consistent diet and exercise  Follow-up in 3 months for repeat A1c  Discussed blood sugar targets   LIPIDS: Hyperlipidemia: On statin drug, LDL around 100Previously and will need to recheck  New prescription for Viagra given  Patient Instructions  Check blood sugars on waking up  3/7  Also check blood sugars about 2 hours after a meal and do this after different meals by rotation  Recommended blood sugar levels on waking up is 90-130 and about 2 hours after meal is 130-160  Please bring your blood sugar monitor to each visit, thank you      [facility] 10/31/2016, 10:15 AM

## 2016-11-27 ENCOUNTER — Encounter: Payer: Self-pay | Admitting: Endocrinology

## 2016-11-28 ENCOUNTER — Other Ambulatory Visit: Payer: Self-pay

## 2016-11-28 MED ORDER — PRAVASTATIN SODIUM 40 MG PO TABS: 40.0000 mg | ORAL_TABLET | Freq: Every day | ORAL | 1 refills | Status: DC

## 2016-11-28 MED ORDER — METFORMIN HCL ER 750 MG PO TB24: 750.0000 mg | ORAL_TABLET | Freq: Two times a day (BID) | ORAL | 1 refills | Status: DC

## 2016-11-28 MED ORDER — DULAGLUTIDE 0.75 MG/0.5ML ~~LOC~~ SOAJ: SUBCUTANEOUS | 1 refills | Status: DC

## 2016-12-27 ENCOUNTER — Other Ambulatory Visit: Payer: Self-pay

## 2016-12-27 MED ORDER — GLIMEPIRIDE 1 MG PO TABS: 1.0000 mg | ORAL_TABLET | Freq: Every day | ORAL | 0 refills | Status: DC

## 2017-01-28 ENCOUNTER — Other Ambulatory Visit (INDEPENDENT_AMBULATORY_CARE_PROVIDER_SITE_OTHER): Payer: Medicare PPO

## 2017-01-28 DIAGNOSIS — E1165 Type 2 diabetes mellitus with hyperglycemia: Secondary | ICD-10-CM | POA: Diagnosis not present

## 2017-01-28 LAB — COMPREHENSIVE METABOLIC PANEL
ALK PHOS: 42 U/L (ref 39–117)
ALT: 16 U/L (ref 0–53)
AST: 15 U/L (ref 0–37)
Albumin: 4.2 g/dL (ref 3.5–5.2)
BUN: 24 mg/dL — ABNORMAL HIGH (ref 6–23)
CHLORIDE: 105 meq/L (ref 96–112)
CO2: 30 meq/L (ref 19–32)
Calcium: 9.9 mg/dL (ref 8.4–10.5)
Creatinine, Ser: 0.98 mg/dL (ref 0.40–1.50)
GFR: 79.98 mL/min (ref >60.00)
GLUCOSE: 132 mg/dL — AB (ref 70–99)
POTASSIUM: 4.4 meq/L (ref 3.5–5.1)
SODIUM: 142 meq/L (ref 135–145)
TOTAL PROTEIN: 6.5 g/dL (ref 6.0–8.3)
Total Bilirubin: 0.8 mg/dL (ref 0.2–1.2)

## 2017-01-28 LAB — LIPID PANEL
CHOLESTEROL: 168 mg/dL (ref 0–200)
HDL: 58.7 mg/dL (ref >39.00)
LDL CALC: 97 mg/dL (ref 0–99)
NonHDL: 108.82
TRIGLYCERIDES: 61 mg/dL (ref 0.0–149.0)
Total CHOL/HDL Ratio: 3
VLDL: 12.2 mg/dL (ref 0.0–40.0)

## 2017-01-28 LAB — HEMOGLOBIN A1C: Hgb A1c MFr Bld: 6.5 % (ref 4.6–6.5)

## 2017-01-31 ENCOUNTER — Encounter: Payer: Self-pay | Admitting: Endocrinology

## 2017-01-31 ENCOUNTER — Ambulatory Visit: Payer: Medicare PPO | Admitting: Endocrinology

## 2017-01-31 VITALS — BP 130/78 | HR 80 | Ht 72.0 in | Wt 183.0 lb

## 2017-01-31 DIAGNOSIS — E7800 Pure hypercholesterolemia, unspecified: Secondary | ICD-10-CM | POA: Diagnosis not present

## 2017-01-31 DIAGNOSIS — Z23 Encounter for immunization: Secondary | ICD-10-CM | POA: Diagnosis not present

## 2017-01-31 DIAGNOSIS — E1165 Type 2 diabetes mellitus with hyperglycemia: Secondary | ICD-10-CM

## 2017-01-31 NOTE — Progress Notes (Signed)
Patient ID: Juan Morales, male   DOB: 10-07-1945, 71 y.o.   MRN: [id number]   Reason for Appointment:  follow-up   History of Present Illness   Diagnosis: Type 2 DIABETES MELITUS  He was initially diagnosed with prediabetes in 2003. Previously had relatively mild diabetes which was previously well-controlled with low-dose Jentadueto or Janumet XR His A1c had been usually under 7%   When his A1c had gone up to 7.6% in 7/15 he was started on Trulicity  6.81 mg weekly.  He also continues to take 1500 mg of metformin  In 8/18 because of relatively high A1c and fasting sugars he was started on Amaryl 1 mg at dinnertime  A1c is now down to 6.5, previously was again 7.6  Current blood sugar patterns and problems identified:  He did finally bring his blood sugar meter for download  Also has done a few readings after meals which he normally does not  Recently his fasting readings are excellent with an average of 117  Blood sugar range the rest of the day 88-1 55 although these readings are sporadic and only a couple of readings in the evenings after supper  No hypoglycemic symptoms with Amaryl  He is still taking the relatively lower dose of Trulicity, this is still relatively expensive for him but he would like to continue because of the benefits and good results  With this and on his own is trying to watch his portions and eat relatively healthy meals with less sweets  He is still trying to walk fairly regularly   Side effects from medications:  none  Monitors blood glucose: Once a day or less.    Glucometer: Accucheck        Blood Glucose readings by download as above:  Overall average at home 118 with highest reading 155  Physical activity: exercise: Walking recently 20-25 miles per week   Wt Readings from Last 3 Encounters:  01/31/17 183 lb (83 kg)  10/31/16 180 lb 3.2 oz (81.7 kg)  04/26/16 187 lb (84.8 kg)   Lab Results  Component Value Date   HGBA1C 6.5  01/28/2017   HGBA1C 7.6 (H) 10/22/2016   HGBA1C 7.6 (H) 04/23/2016   Lab Results  Component Value Date   MICROALBUR 1.3 04/23/2016   LDLCALC 97 01/28/2017   CREATININE 0.98 01/28/2017    OTHER active problems: See review of systems  Lab on 01/28/2017  Component Date Value Ref Range Status  . Cholesterol 01/28/2017 168  0 - 200 mg/dL Final   ATP III Classification       Desirable:  < 200 mg/dL               Borderline High:  200 - 239 mg/dL          High:  > = 240 mg/dL  . Triglycerides 01/28/2017 61.0  0.0 - 149.0 mg/dL Final   Normal:  <150 mg/dLBorderline High:  150 - 199 mg/dL  . HDL 01/28/2017 58.70  >39.00 mg/dL Final  . VLDL 01/28/2017 12.2  0.0 - 40.0 mg/dL Final  . LDL Cholesterol 01/28/2017 97  0 - 99 mg/dL Final  . Total CHOL/HDL Ratio 01/28/2017 3   Final                  Men          Women1/2 Average Risk     3.4          3.3Average Risk  5.0          4.42X Average Risk          9.6          7.13X Average Risk          15.0          11.0                      . NonHDL 01/28/2017 108.82   Final   NOTE:  Non-HDL goal should be 30 mg/dL higher than patient's LDL goal (i.e. LDL goal of < 70 mg/dL, would have non-HDL goal of < 100 mg/dL)  . Sodium 01/28/2017 142  135 - 145 mEq/L Final  . Potassium 01/28/2017 4.4  3.5 - 5.1 mEq/L Final  . Chloride 01/28/2017 105  96 - 112 mEq/L Final  . CO2 01/28/2017 30  19 - 32 mEq/L Final  . Glucose, Bld 01/28/2017 132* 70 - 99 mg/dL Final  . BUN 01/28/2017 24* 6 - 23 mg/dL Final  . Creatinine, Ser 01/28/2017 0.98  0.40 - 1.50 mg/dL Final  . Total Bilirubin 01/28/2017 0.8  0.2 - 1.2 mg/dL Final  . Alkaline Phosphatase 01/28/2017 42  39 - 117 U/L Final  . AST 01/28/2017 15  0 - 37 U/L Final  . ALT 01/28/2017 16  0 - 53 U/L Final  . Total Protein 01/28/2017 6.5  6.0 - 8.3 g/dL Final  . Albumin 01/28/2017 4.2  3.5 - 5.2 g/dL Final  . Calcium 01/28/2017 9.9  8.4 - 10.5 mg/dL Final  . GFR 01/28/2017 79.98  >60.00 mL/min Final  .  Hgb A1c MFr Bld 01/28/2017 6.5  4.6 - 6.5 % Final   Glycemic Control Guidelines for People with Diabetes:Non Diabetic:  <6%Goal of Therapy: <7%Additional Action Suggested:  >8%     Allergies as of 01/31/2017   No Known Allergies     Medication List        Accurate as of 01/31/17  4:04 PM. Always use your most recent med list.          ACCU-CHEK NANO SMARTVIEW w/Device Kit Use to check blood sugar once a day dx code E11.65   ACCU-CHEK SMARTVIEW test strip Generic drug:  glucose blood 1 each by Other route 3 (three) times daily. Use as instructed   calcium carbonate 1250 (500 Ca) MG tablet Commonly known as:  OS-CAL - dosed in mg of elemental calcium Take 1 tablet by mouth daily.   Chromium 1000 MCG Tabs Take 1 tablet by mouth daily.   Dulaglutide 0.75 MG/0.5ML Sopn Commonly known as:  TRULICITY INJECT 3.76 MG (0.5 ML) ONE TIME WEEKLY   Fish Oil 1000 MG Caps Take 1 capsule by mouth daily.   glimepiride 1 MG tablet Commonly known as:  AMARYL Take 1 tablet (1 mg total) by mouth daily before supper.   metFORMIN 750 MG 24 hr tablet Commonly known as:  GLUCOPHAGE-XR Take 1 tablet (750 mg total) by mouth 2 (two) times daily.   multivitamin with minerals tablet Take 1 tablet by mouth daily.   pravastatin 40 MG tablet Commonly known as:  PRAVACHOL Take 1 tablet (40 mg total) by mouth daily.   sildenafil 100 MG tablet Commonly known as:  VIAGRA Take 1 tablet (100 mg total) by mouth daily as needed for erectile dysfunction.   zinc gluconate 50 MG tablet Take 50 mg by mouth daily.       Allergies: No Known Allergies  Past Medical History:  Diagnosis Date  . Arthritis   . Diabetes mellitus without complication ([place])   . Erectile dysfunction   . Hyperlipidemia   . Macular degeneration     Past Surgical History:  Procedure Laterality Date  . arthroscopic knee Left    times 2  . EYE SURGERY Bilateral    cataract removal    Family History  Problem  Relation Age of Onset  . Diabetes Neg Hx   . Heart disease Neg Hx   . Stroke Neg Hx   . Hypertension Father     Social History:  reports that  has never smoked. he has never used smokeless tobacco. He reports that he does not drink alcohol or use drugs.  Review of Systems:  Has macular degeneration, has regular exams  No history of hypertension  but his blood pressure has been high normal in the office At times  Previously with low-dose ramipril he tended to have low blood pressure readings Recently he did not checking his blood pressure at home, normally thinks it is fairly good    HYPERLIPIDEMIA: The lipid abnormality consists of elevated LDL.  Has been treated with pravastatin, last LDL 101 and this is now 41 He continues to take pravastatin regularly   Lab Results  Component Value Date   CHOL 168 01/28/2017   HDL 58.70 01/28/2017   LDLCALC 97 01/28/2017   LDLDIRECT 144.3 02/16/2014   TRIG 61.0 01/28/2017   CHOLHDL 3 01/28/2017    He has had long-standing erectile dysfunction,  has been prescribed Viagra with good results      Examination:   BP 130/78   Pulse 80   Ht 6' (1.829 m)   Wt 183 lb (83 kg)   SpO2 94%   BMI 24.82 kg/m   Body mass index is 24.82 kg/m.      ASSESSMENT/ PLAN:   Diabetes type 2 with BMI 25 See history of present illness for detailed discussion of current diabetes management, blood sugar patterns and problems identified  His blood sugars are much better controlled with adding 1 mg Amaryl to his metformin and Trulicity U1T is the best in the last few years at 6.5  He is compliant with all his medications Fasting readings are fairly consistent and averaging 117, previously as high as 176 Although he has tried to do a few readings after meals later in the day desire only infrequent, hyoscyamine and 55 He is usually trying to be fairly consistent with walking and trying to do better with his diet   Recommendations:  He would try to  do more readings after meals and less in the morning  No change in medications  Discussed that if he does tend to have low sugars he will need to cut back or stop Amaryl  Continue same dose of Trulicity for now   LIPIDS: Hyperlipidemia: On statin drug, LDL around 100  Recommended continuing pravastatin  Discussed benefits of influenza vaccine which he has not done for years and this was given today  There are no Patient Instructions on file for this visit.    Cherish Runde 01/31/2017, 4:04 PM

## 2017-01-31 NOTE — Patient Instructions (Signed)
Check blood sugars on waking up  4/7  Also check blood sugars about 2 hours after a meal and do this after different meals by rotation  Recommended blood sugar levels on waking up is 90-130 and about 2 hours after meal is 130-160  Please bring your blood sugar monitor to each visit, thank you  

## 2017-03-22 ENCOUNTER — Other Ambulatory Visit: Payer: Self-pay | Admitting: Endocrinology

## 2017-06-19 ENCOUNTER — Other Ambulatory Visit: Payer: Self-pay | Admitting: Endocrinology

## 2017-06-26 DIAGNOSIS — L570 Actinic keratosis: Secondary | ICD-10-CM | POA: Diagnosis not present

## 2017-06-26 DIAGNOSIS — L578 Other skin changes due to chronic exposure to nonionizing radiation: Secondary | ICD-10-CM | POA: Diagnosis not present

## 2017-06-26 DIAGNOSIS — E119 Type 2 diabetes mellitus without complications: Secondary | ICD-10-CM | POA: Diagnosis not present

## 2017-06-26 DIAGNOSIS — Z1283 Encounter for screening for malignant neoplasm of skin: Secondary | ICD-10-CM | POA: Diagnosis not present

## 2017-06-26 DIAGNOSIS — L821 Other seborrheic keratosis: Secondary | ICD-10-CM | POA: Diagnosis not present

## 2017-07-02 DIAGNOSIS — H33311 Horseshoe tear of retina without detachment, right eye: Secondary | ICD-10-CM | POA: Diagnosis not present

## 2017-07-02 DIAGNOSIS — H353212 Exudative age-related macular degeneration, right eye, with inactive choroidal neovascularization: Secondary | ICD-10-CM | POA: Diagnosis not present

## 2017-07-02 DIAGNOSIS — H353121 Nonexudative age-related macular degeneration, left eye, early dry stage: Secondary | ICD-10-CM | POA: Diagnosis not present

## 2017-07-02 DIAGNOSIS — H35372 Puckering of macula, left eye: Secondary | ICD-10-CM | POA: Diagnosis not present

## 2017-07-11 DIAGNOSIS — L570 Actinic keratosis: Secondary | ICD-10-CM | POA: Diagnosis not present

## 2017-07-12 ENCOUNTER — Encounter: Payer: Self-pay | Admitting: Endocrinology

## 2017-07-12 ENCOUNTER — Other Ambulatory Visit: Payer: Self-pay | Admitting: Endocrinology

## 2017-07-15 MED ORDER — GLIMEPIRIDE 1 MG PO TABS: 1.0000 mg | ORAL_TABLET | Freq: Every day | ORAL | 0 refills | Status: DC

## 2017-07-23 DIAGNOSIS — H33311 Horseshoe tear of retina without detachment, right eye: Secondary | ICD-10-CM | POA: Diagnosis not present

## 2017-07-23 LAB — HM DIABETES EYE EXAM

## 2017-07-29 ENCOUNTER — Other Ambulatory Visit: Payer: Medicare PPO

## 2017-08-01 ENCOUNTER — Ambulatory Visit: Payer: Medicare PPO | Admitting: Endocrinology

## 2017-08-01 DIAGNOSIS — Z0289 Encounter for other administrative examinations: Secondary | ICD-10-CM

## 2017-08-05 ENCOUNTER — Other Ambulatory Visit: Payer: Self-pay | Admitting: Endocrinology

## 2017-08-05 ENCOUNTER — Encounter: Payer: Self-pay | Admitting: Endocrinology

## 2017-08-28 ENCOUNTER — Other Ambulatory Visit: Payer: Self-pay

## 2017-08-28 ENCOUNTER — Encounter: Payer: Self-pay | Admitting: Endocrinology

## 2017-09-16 ENCOUNTER — Other Ambulatory Visit (INDEPENDENT_AMBULATORY_CARE_PROVIDER_SITE_OTHER): Payer: Medicare PPO

## 2017-09-16 DIAGNOSIS — E1165 Type 2 diabetes mellitus with hyperglycemia: Secondary | ICD-10-CM

## 2017-09-16 LAB — COMPREHENSIVE METABOLIC PANEL
ALT: 19 U/L (ref 0–53)
AST: 18 U/L (ref 0–37)
Albumin: 4.1 g/dL (ref 3.5–5.2)
Alkaline Phosphatase: 52 U/L (ref 39–117)
BUN: 20 mg/dL (ref 6–23)
CALCIUM: 10.2 mg/dL (ref 8.4–10.5)
CHLORIDE: 103 meq/L (ref 96–112)
CO2: 30 meq/L (ref 19–32)
CREATININE: 1.06 mg/dL (ref 0.40–1.50)
GFR: 72.93 mL/min (ref >60.00)
Glucose, Bld: 186 mg/dL — ABNORMAL HIGH (ref 70–99)
POTASSIUM: 4.7 meq/L (ref 3.5–5.1)
SODIUM: 138 meq/L (ref 135–145)
Total Bilirubin: 0.5 mg/dL (ref 0.2–1.2)
Total Protein: 6.6 g/dL (ref 6.0–8.3)

## 2017-09-16 LAB — MICROALBUMIN / CREATININE URINE RATIO
CREATININE, U: 147.3 mg/dL
MICROALB UR: 1 mg/dL (ref 0.0–1.9)
Microalb Creat Ratio: 0.7 mg/g (ref 0.0–30.0)

## 2017-09-16 LAB — LIPID PANEL
Cholesterol: 214 mg/dL — ABNORMAL HIGH (ref 0–200)
HDL: 58.2 mg/dL (ref >39.00)
LDL CALC: 137 mg/dL — AB (ref 0–99)
NonHDL: 156.03
Total CHOL/HDL Ratio: 4
Triglycerides: 95 mg/dL (ref 0.0–149.0)
VLDL: 19 mg/dL (ref 0.0–40.0)

## 2017-09-16 LAB — HEMOGLOBIN A1C: Hgb A1c MFr Bld: 7.1 % — ABNORMAL HIGH (ref 4.6–6.5)

## 2017-09-19 ENCOUNTER — Ambulatory Visit: Payer: Medicare PPO | Admitting: Endocrinology

## 2017-09-19 ENCOUNTER — Encounter: Payer: Self-pay | Admitting: Endocrinology

## 2017-09-19 VITALS — BP 120/72 | HR 85 | Ht 72.0 in | Wt 187.0 lb

## 2017-09-19 DIAGNOSIS — E1165 Type 2 diabetes mellitus with hyperglycemia: Secondary | ICD-10-CM | POA: Diagnosis not present

## 2017-09-19 NOTE — Patient Instructions (Signed)
Check blood sugars on waking up  2-3 days  Also check blood sugars about 2 hours after a meal and do this after different meals by rotation  Recommended blood sugar levels on waking up is 90-130 and about 2 hours after meal is 130-160  Please bring your blood sugar monitor to each visit, thank you

## 2017-09-19 NOTE — Progress Notes (Signed)
Patient ID: Juan Morales, male   DOB: 10-04-1945, 72 y.o.   MRN: [id number]   Reason for Appointment:  follow-up   History of Present Illness   Diagnosis: Type 2 DIABETES MELITUS  He was initially diagnosed with prediabetes in 2003. Previously had relatively mild diabetes which was previously well-controlled with low-dose Jentadueto or Janumet XR His A1c had been usually under 7%   When his A1c had gone up to 7.6% in 7/15 he was started on Trulicity  5.46 mg weekly.   He also continues to take 1500 mg of metformin   In 8/18 because of relatively high A1c and fasting sugars he was started on Amaryl 1 mg at dinnertime  A1c is now 7.1, previous range 6.5-7.6  Current blood sugar patterns and problems identified:  He has not been seen for several months now  Even though his A1c is only slightly higher he has been relatively noncompliant with his diet and exercise regimen  He says that he is usually not watching his diet when he is traveling and has not been paying attention to his meals for the last 6 weeks  Also since his wife cannot walk with him he has not started back on his walking program  Despite reminders again he does not check readings after meals except once when he checked blood sugar around lunchtime  FASTING blood sugar range 117-168 at home with AVERAGE 144  He says he has been regular with his Trulicity, metformin and low-dose Amaryl   Side effects from medications:  none  Monitors blood glucose: Once a day or less.    Glucometer: Accucheck        Blood Glucose readings by download as above  Only doing morning readings  Physical activity: exercise: Walking irregularly   Wt Readings from Last 3 Encounters:  09/19/17 187 lb (84.8 kg)  01/31/17 183 lb (83 kg)  10/31/16 180 lb 3.2 oz (81.7 kg)   Lab Results  Component Value Date   HGBA1C 7.1 (H) 09/16/2017   HGBA1C 6.5 01/28/2017   HGBA1C 7.6 (H) 10/22/2016   Lab Results  Component Value  Date   MICROALBUR 1.0 09/16/2017   LDLCALC 137 (H) 09/16/2017   CREATININE 1.06 09/16/2017    OTHER active problems: See review of systems  Lab on 09/16/2017  Component Date Value Ref Range Status  . Cholesterol 09/16/2017 214* 0 - 200 mg/dL Final   ATP III Classification       Desirable:  < 200 mg/dL               Borderline High:  200 - 239 mg/dL          High:  > = 240 mg/dL  . Triglycerides 09/16/2017 95.0  0.0 - 149.0 mg/dL Final   Normal:  <150 mg/dLBorderline High:  150 - 199 mg/dL  . HDL 09/16/2017 58.20  >39.00 mg/dL Final  . VLDL 09/16/2017 19.0  0.0 - 40.0 mg/dL Final  . LDL Cholesterol 09/16/2017 137* 0 - 99 mg/dL Final  . Total CHOL/HDL Ratio 09/16/2017 4   Final                  Men          Women1/2 Average Risk     3.4          3.3Average Risk          5.0          4.42X Average Risk  9.6          7.13X Average Risk          15.0          11.0                      . NonHDL 09/16/2017 156.03   Final   NOTE:  Non-HDL goal should be 30 mg/dL higher than patient's LDL goal (i.e. LDL goal of < 70 mg/dL, would have non-HDL goal of < 100 mg/dL)  . Microalb, Ur 09/16/2017 1.0  0.0 - 1.9 mg/dL Final  . Creatinine,U 09/16/2017 147.3  mg/dL Final  . Microalb Creat Ratio 09/16/2017 0.7  0.0 - 30.0 mg/g Final  . Sodium 09/16/2017 138  135 - 145 mEq/L Final  . Potassium 09/16/2017 4.7  3.5 - 5.1 mEq/L Final  . Chloride 09/16/2017 103  96 - 112 mEq/L Final  . CO2 09/16/2017 30  19 - 32 mEq/L Final  . Glucose, Bld 09/16/2017 186* 70 - 99 mg/dL Final  . BUN 09/16/2017 20  6 - 23 mg/dL Final  . Creatinine, Ser 09/16/2017 1.06  0.40 - 1.50 mg/dL Final  . Total Bilirubin 09/16/2017 0.5  0.2 - 1.2 mg/dL Final  . Alkaline Phosphatase 09/16/2017 52  39 - 117 U/L Final  . AST 09/16/2017 18  0 - 37 U/L Final  . ALT 09/16/2017 19  0 - 53 U/L Final  . Total Protein 09/16/2017 6.6  6.0 - 8.3 g/dL Final  . Albumin 09/16/2017 4.1  3.5 - 5.2 g/dL Final  . Calcium 09/16/2017 10.2  8.4 -  10.5 mg/dL Final  . GFR 09/16/2017 72.93  >60.00 mL/min Final  . Hgb A1c MFr Bld 09/16/2017 7.1* 4.6 - 6.5 % Final   Glycemic Control Guidelines for People with Diabetes:Non Diabetic:  <6%Goal of Therapy: <7%Additional Action Suggested:  >8%     Allergies as of 09/19/2017   No Known Allergies     Medication List        Accurate as of 09/19/17  3:31 PM. Always use your most recent med list.          ACCU-CHEK NANO SMARTVIEW w/Device Kit Use to check blood sugar once a day dx code E11.65   ACCU-CHEK SMARTVIEW test strip Generic drug:  glucose blood 1 each by Other route 3 (three) times daily. Use as instructed   calcium carbonate 1250 (500 Ca) MG tablet Commonly known as:  OS-CAL - dosed in mg of elemental calcium Take 1 tablet by mouth daily.   Chromium 1000 MCG Tabs Take 1 tablet by mouth daily.   Fish Oil 1000 MG Caps Take 1 capsule by mouth daily.   glimepiride 1 MG tablet Commonly known as:  AMARYL Take 1 tablet (1 mg total) by mouth daily before supper.   metFORMIN 750 MG 24 hr tablet Commonly known as:  GLUCOPHAGE-XR Take 1 tablet (750 mg total) by mouth 2 (two) times daily.   multivitamin with minerals tablet Take 1 tablet by mouth daily.   pravastatin 40 MG tablet Commonly known as:  PRAVACHOL Take 1 tablet (40 mg total) by mouth daily.   sildenafil 100 MG tablet Commonly known as:  VIAGRA Take 1 tablet (100 mg total) by mouth daily as needed for erectile dysfunction.   TRULICITY 4.49 QP/5.9FM Sopn Generic drug:  Dulaglutide INJECT 0.75 MG (0.5 ML) SUBCUTANEOUSLY ONE TIME WEEKLY   zinc gluconate 50 MG tablet Take 50 mg by mouth daily.  Allergies: No Known Allergies  Past Medical History:  Diagnosis Date  . Arthritis   . Diabetes mellitus without complication ([place])   . Erectile dysfunction   . Hyperlipidemia   . Macular degeneration     Past Surgical History:  Procedure Laterality Date  . arthroscopic knee Left    times 2  . EYE  SURGERY Bilateral    cataract removal  . PARTIAL KNEE ARTHROPLASTY Left 07/26/2015   Procedure: LEFT UNICOMPARTMENTAL KNEE ARTHROPLASTY;  Surgeon: Mcarthur Rossetti, MD;  Location: [facility];  Service: Orthopedics;  Laterality: Left;    Family History  Problem Relation Age of Onset  . Diabetes Neg Hx   . Heart disease Neg Hx   . Stroke Neg Hx   . Hypertension Father     Social History:  reports that he has never smoked. He has never used smokeless tobacco. He reports that he does not drink alcohol or use drugs.  Review of Systems:  Has macular degeneration, has regular exams, need to get a copy of his reports  No history of hypertension  but his blood pressure has been high normal in the office in the past  HYPERLIPIDEMIA: The lipid abnormality consists of elevated LDL.  Has been treated with pravastatin, last LDL was better and this is now 137  He did not refill his prescription for pravastatin and lipids are worse   Lab Results  Component Value Date   CHOL 214 (H) 09/16/2017   HDL 58.20 09/16/2017   LDLCALC 137 (H) 09/16/2017   LDLDIRECT 144.3 02/16/2014   TRIG 95.0 09/16/2017   CHOLHDL 4 09/16/2017    He has had long-standing erectile dysfunction,  has been prescribed Viagra with good results      Examination:   BP 120/72 (BP Location: Left Arm, Patient Position: Sitting, Cuff Size: Normal)   Pulse 85   Ht 6' (1.829 m)   Wt 187 lb (84.8 kg)   SpO2 95%   BMI 25.36 kg/m   Body mass index is 25.36 kg/m.     Diabetic Foot Exam - Simple   Simple Foot Form Diabetic Foot exam was performed with the following findings:  Yes   Visual Inspection No deformities, no ulcerations, no other skin breakdown bilaterally:  Yes Sensation Testing Intact to touch and monofilament testing bilaterally:  Yes Pulse Check Posterior Tibialis and Dorsalis pulse intact bilaterally:  Yes Comments      ASSESSMENT/ PLAN:   Diabetes type 2 with BMI 25 See history of present  illness for detailed discussion of current diabetes management, blood sugar patterns and problems identified  His blood sugars are not as well controlled even though on his last visit his A1c was 6.5  He is generally not compliant with his exercise and diet regimen especially recently causing higher readings Also not checking readings after meals However no hypoglycemia with taking 1 mg Amaryl in addition to his Trulicity and metformin He will continue the same regimen of his medication but encouraged him to start walking on his own regularly Also needs to be making better choices with carbohydrates and high-fat foods To do some readings at bedtime also   LIPIDS: Hyperlipidemia: On statin drug, LDL higher because of noncompliance with his medication  Recommended taking pravastatin regularly   Follow-up in 3 months  Patient Instructions  Check blood sugars on waking up  2-3 days  Also check blood sugars about 2 hours after a meal and do this after different meals by rotation  Recommended blood sugar levels on waking up is 90-130 and about 2 hours after meal is 130-160  Please bring your blood sugar monitor to each visit, thank you      Elayne Snare 09/19/2017, 3:31 PM

## 2017-09-21 ENCOUNTER — Encounter: Payer: Self-pay | Admitting: Endocrinology

## 2017-09-23 ENCOUNTER — Other Ambulatory Visit: Payer: Self-pay

## 2017-09-23 MED ORDER — DULAGLUTIDE 0.75 MG/0.5ML ~~LOC~~ SOAJ: SUBCUTANEOUS | 1 refills | Status: DC

## 2017-09-23 MED ORDER — PRAVASTATIN SODIUM 40 MG PO TABS: 40.0000 mg | ORAL_TABLET | Freq: Every day | ORAL | 1 refills | Status: DC

## 2017-09-23 MED ORDER — METFORMIN HCL ER 750 MG PO TB24: 750.0000 mg | ORAL_TABLET | Freq: Two times a day (BID) | ORAL | 1 refills | Status: DC

## 2017-12-11 ENCOUNTER — Other Ambulatory Visit: Payer: Self-pay | Admitting: Endocrinology

## 2017-12-16 ENCOUNTER — Other Ambulatory Visit (INDEPENDENT_AMBULATORY_CARE_PROVIDER_SITE_OTHER): Payer: Medicare PPO

## 2017-12-16 DIAGNOSIS — E1165 Type 2 diabetes mellitus with hyperglycemia: Secondary | ICD-10-CM | POA: Diagnosis not present

## 2017-12-16 LAB — LIPID PANEL
CHOLESTEROL: 161 mg/dL (ref 0–200)
HDL: 55.5 mg/dL (ref >39.00)
LDL Cholesterol: 91 mg/dL (ref 0–99)
NonHDL: 105.69
Total CHOL/HDL Ratio: 3
Triglycerides: 75 mg/dL (ref 0.0–149.0)
VLDL: 15 mg/dL (ref 0.0–40.0)

## 2017-12-16 LAB — COMPREHENSIVE METABOLIC PANEL
ALBUMIN: 4.1 g/dL (ref 3.5–5.2)
ALT: 16 U/L (ref 0–53)
AST: 16 U/L (ref 0–37)
Alkaline Phosphatase: 41 U/L (ref 39–117)
BILIRUBIN TOTAL: 0.6 mg/dL (ref 0.2–1.2)
BUN: 20 mg/dL (ref 6–23)
CALCIUM: 9.3 mg/dL (ref 8.4–10.5)
CO2: 28 mEq/L (ref 19–32)
Chloride: 105 mEq/L (ref 96–112)
Creatinine, Ser: 0.95 mg/dL (ref 0.40–1.50)
GFR: 82.7 mL/min (ref >60.00)
Glucose, Bld: 136 mg/dL — ABNORMAL HIGH (ref 70–99)
Potassium: 4.5 mEq/L (ref 3.5–5.1)
Sodium: 141 mEq/L (ref 135–145)
Total Protein: 6.6 g/dL (ref 6.0–8.3)

## 2017-12-16 LAB — HEMOGLOBIN A1C: HEMOGLOBIN A1C: 7.1 % — AB (ref 4.6–6.5)

## 2017-12-19 ENCOUNTER — Encounter: Payer: Self-pay | Admitting: Endocrinology

## 2017-12-19 ENCOUNTER — Ambulatory Visit: Payer: Medicare PPO | Admitting: Endocrinology

## 2017-12-19 ENCOUNTER — Ambulatory Visit (INDEPENDENT_AMBULATORY_CARE_PROVIDER_SITE_OTHER): Payer: Medicare PPO | Admitting: Endocrinology

## 2017-12-19 VITALS — BP 122/80 | HR 84 | Ht 72.0 in | Wt 183.0 lb

## 2017-12-19 DIAGNOSIS — Z23 Encounter for immunization: Secondary | ICD-10-CM | POA: Diagnosis not present

## 2017-12-19 DIAGNOSIS — E1165 Type 2 diabetes mellitus with hyperglycemia: Secondary | ICD-10-CM | POA: Diagnosis not present

## 2017-12-19 NOTE — Progress Notes (Signed)
Patient ID: Juan Morales, male   DOB: [birth year], 72 y.o.   MRN: [id number]   Reason for Appointment:  follow-up   History of Present Illness   Diagnosis: Type 2 DIABETES MELITUS  He was initially diagnosed with prediabetes in 2003. Previously had relatively mild diabetes which was previously well-controlled with low-dose Jentadueto or Janumet XR His A1c had been usually under 7%   When his A1c had gone up to 7.6% in 7/15 he was started on Trulicity  7.68 mg weekly.   He also continues to take 1500 mg of metformin   In 8/18 because of relatively high A1c and fasting sugars he was started on Amaryl 1 mg at dinnertime  A1c is unchanged at 7.1, previous range 6.5-7.6  Current blood sugar patterns, daily management and problems identified:  He is checking blood sugars only fasting and rarely later in the day  Not clear why his A1c is higher since it has been previously better he does not have any documented high blood sugars at home highest blood sugar 169 at bedtime  He does not think he is planning his meals well and not consistently watching his diet although doing a little better than the last time  He has in the last month or so walked regularly and his weight is down 4 pounds  Has no side effects from Trulicity and has been taking this and the Amaryl consistently as directed  Fasting readings are slightly better than the last time   Side effects from medications:  none  Monitors blood glucose: Once a day or less.    Glucometer: Accucheck        Blood Glucose readings by download as above  30-day fasting range 99-140 nonfasting 85-169 AVERAGE overall 123  Physical activity: exercise: Walking 3 miles daily over the last 1 month   Wt Readings from Last 3 Encounters:  12/19/17 183 lb (83 kg)  09/19/17 187 lb (84.8 kg)  01/31/17 183 lb (83 kg)   Lab Results  Component Value Date   HGBA1C 7.1 (H) 12/16/2017   HGBA1C 7.1 (H) 09/16/2017   HGBA1C 6.5  01/28/2017   Lab Results  Component Value Date   MICROALBUR 1.0 09/16/2017   LDLCALC 91 12/16/2017   CREATININE 0.95 12/16/2017    OTHER active problems: See review of systems  Lab on 12/16/2017  Component Date Value Ref Range Status  . Cholesterol 12/16/2017 161  0 - 200 mg/dL Final   ATP III Classification       Desirable:  < 200 mg/dL               Borderline High:  200 - 239 mg/dL          High:  > = 240 mg/dL  . Triglycerides 12/16/2017 75.0  0.0 - 149.0 mg/dL Final   Normal:  <150 mg/dLBorderline High:  150 - 199 mg/dL  . HDL 12/16/2017 55.50  >39.00 mg/dL Final  . VLDL 12/16/2017 15.0  0.0 - 40.0 mg/dL Final  . LDL Cholesterol 12/16/2017 91  0 - 99 mg/dL Final  . Total CHOL/HDL Ratio 12/16/2017 3   Final                  Men          Women1/2 Average Risk     3.4          3.3Average Risk          5.0  4.42X Average Risk          9.6          7.13X Average Risk          15.0          11.0                      . NonHDL 12/16/2017 105.69   Final   NOTE:  Non-HDL goal should be 30 mg/dL higher than patient's LDL goal (i.e. LDL goal of < 70 mg/dL, would have non-HDL goal of < 100 mg/dL)  . Sodium 12/16/2017 141  135 - 145 mEq/L Final  . Potassium 12/16/2017 4.5  3.5 - 5.1 mEq/L Final  . Chloride 12/16/2017 105  96 - 112 mEq/L Final  . CO2 12/16/2017 28  19 - 32 mEq/L Final  . Glucose, Bld 12/16/2017 136* 70 - 99 mg/dL Final  . BUN 12/16/2017 20  6 - 23 mg/dL Final  . Creatinine, Ser 12/16/2017 0.95  0.40 - 1.50 mg/dL Final  . Total Bilirubin 12/16/2017 0.6  0.2 - 1.2 mg/dL Final  . Alkaline Phosphatase 12/16/2017 41  39 - 117 U/L Final  . AST 12/16/2017 16  0 - 37 U/L Final  . ALT 12/16/2017 16  0 - 53 U/L Final  . Total Protein 12/16/2017 6.6  6.0 - 8.3 g/dL Final  . Albumin 12/16/2017 4.1  3.5 - 5.2 g/dL Final  . Calcium 12/16/2017 9.3  8.4 - 10.5 mg/dL Final  . GFR 12/16/2017 82.70  >60.00 mL/min Final  . Hgb A1c MFr Bld 12/16/2017 7.1* 4.6 - 6.5 % Final    Glycemic Control Guidelines for People with Diabetes:Non Diabetic:  <6%Goal of Therapy: <7%Additional Action Suggested:  >8%     Allergies as of 12/19/2017   No Known Allergies     Medication List        Accurate as of 12/19/17  2:34 PM. Always use your most recent med list.          ACCU-CHEK NANO SMARTVIEW w/Device Kit Use to check blood sugar once a day dx code E11.65   ACCU-CHEK SMARTVIEW test strip Generic drug:  glucose blood 1 each by Other route 3 (three) times daily. Use as instructed   calcium carbonate 1250 (500 Ca) MG tablet Commonly known as:  OS-CAL - dosed in mg of elemental calcium Take 1 tablet by mouth daily.   Chromium 1000 MCG Tabs Take 1 tablet by mouth daily.   Fish Oil 1000 MG Caps Take 1 capsule by mouth daily.   glimepiride 1 MG tablet Commonly known as:  AMARYL Take 1 tablet (1 mg total) by mouth daily before supper.   metFORMIN 750 MG 24 hr tablet Commonly known as:  GLUCOPHAGE-XR Take 1 tablet (750 mg total) by mouth 2 (two) times daily.   multivitamin with minerals tablet Take 1 tablet by mouth daily.   pravastatin 40 MG tablet Commonly known as:  PRAVACHOL Take 1 tablet (40 mg total) by mouth daily.   sildenafil 100 MG tablet Commonly known as:  VIAGRA Take 1 tablet (100 mg total) by mouth daily as needed for erectile dysfunction.   TRULICITY 1.44 YJ/8.5UD Sopn Generic drug:  Dulaglutide INJECT  0.75MG  (0.5ML) SUBCUTANEOUSLY EVERY WEEK   zinc gluconate 50 MG tablet Take 50 mg by mouth daily.       Allergies: No Known Allergies  Past Medical History:  Diagnosis Date  . Arthritis   .  Diabetes mellitus without complication ([place])   . Erectile dysfunction   . Hyperlipidemia   . Macular degeneration     Past Surgical History:  Procedure Laterality Date  . arthroscopic knee Left    times 2  . EYE SURGERY Bilateral    cataract removal  . PARTIAL KNEE ARTHROPLASTY Left 07/26/2015   Procedure: LEFT UNICOMPARTMENTAL KNEE  ARTHROPLASTY;  Surgeon: Mcarthur Rossetti, MD;  Location: [facility];  Service: Orthopedics;  Laterality: Left;    Family History  Problem Relation Age of Onset  . Diabetes Neg Hx   . Heart disease Neg Hx   . Stroke Neg Hx   . Hypertension Father     Social History:  reports that he has never smoked. He has never used smokeless tobacco. He reports that he does not drink alcohol or use drugs.  Review of Systems:  Has macular degeneration, has regular exams, need to get a copy of his reports  No history of hypertension  but his blood pressure has been high normal in the office in the past  HYPERLIPIDEMIA: The lipid abnormality consists of elevated LDL.  Has been treated with pravastatin, last LDL was high because of running out of medication and now LDL is back to below 100    Lab Results  Component Value Date   CHOL 161 12/16/2017   HDL 55.50 12/16/2017   LDLCALC 91 12/16/2017   LDLDIRECT 144.3 02/16/2014   TRIG 75.0 12/16/2017   CHOLHDL 3 12/16/2017    He has had long-standing erectile dysfunction,  has been prescribed Viagra with good results      Examination:   BP 122/80   Pulse 84   Ht 6' (1.829 m)   Wt 183 lb (83 kg)   SpO2 96%   BMI 24.82 kg/m   Body mass index is 24.82 kg/m.      ASSESSMENT/ PLAN:   Diabetes type 2 with BMI 25 See history of present illness for detailed discussion of current diabetes management, blood sugar patterns and problems identified  A1c 7.1  Although his fasting readings are excellent and averaging only 119 his A1c indicates postprandial hyperglycemia at times over the last 3 months He was made aware of this He thinks he is doing better in the last month with both diet and exercise However does need to check readings after meals more often For now we will continue the same dose of Trulicity 7.37 mg weekly along with Amaryl   LIPIDS: Hyperlipidemia: On statin drug, LDL better with his taking pravastatin daily  now   Follow-up in 4 months, encouraged him to establish with a PCP for preventive care and regular preventive exams  Flu vaccine given  He needs to have the eye exam report sent for documentation  There are no Patient Instructions on file for this visit.    Elayne Snare 12/19/2017, 2:34 PM

## 2017-12-19 NOTE — Patient Instructions (Addendum)
Check blood sugars on waking up  2/7 days  Also check blood sugars about 2 hours after a meal and do this after different meals by rotation  Recommended blood sugar levels on waking up is 90-130 and about 2 hours after meal is 130-160  Please bring your blood sugar monitor to each visit, thank you  Eye report

## 2018-01-15 ENCOUNTER — Other Ambulatory Visit: Payer: Self-pay | Admitting: Endocrinology

## 2018-02-11 ENCOUNTER — Other Ambulatory Visit: Payer: Self-pay | Admitting: Endocrinology

## 2018-02-11 ENCOUNTER — Other Ambulatory Visit: Payer: Self-pay

## 2018-02-11 MED ORDER — GLIMEPIRIDE 1 MG PO TABS: ORAL_TABLET | ORAL | 1 refills | Status: DC

## 2018-02-13 DIAGNOSIS — L812 Freckles: Secondary | ICD-10-CM | POA: Diagnosis not present

## 2018-02-13 DIAGNOSIS — L821 Other seborrheic keratosis: Secondary | ICD-10-CM | POA: Diagnosis not present

## 2018-02-13 DIAGNOSIS — L578 Other skin changes due to chronic exposure to nonionizing radiation: Secondary | ICD-10-CM | POA: Diagnosis not present

## 2018-02-13 DIAGNOSIS — L570 Actinic keratosis: Secondary | ICD-10-CM | POA: Diagnosis not present

## 2018-04-21 ENCOUNTER — Other Ambulatory Visit: Payer: Medicare PPO

## 2018-04-21 ENCOUNTER — Other Ambulatory Visit: Payer: Self-pay | Admitting: Endocrinology

## 2018-04-24 ENCOUNTER — Ambulatory Visit: Payer: Medicare PPO | Admitting: Endocrinology

## 2018-04-30 IMAGING — CR DG KNEE 1-2V PORT*L*
2 series · 2 of 2 positions shown · non-contrast
Comparison: None.

CLINICAL DATA: Postop left partial knee replacement

EXAM:
PORTABLE LEFT KNEE - 1-2 VIEW

[AP]
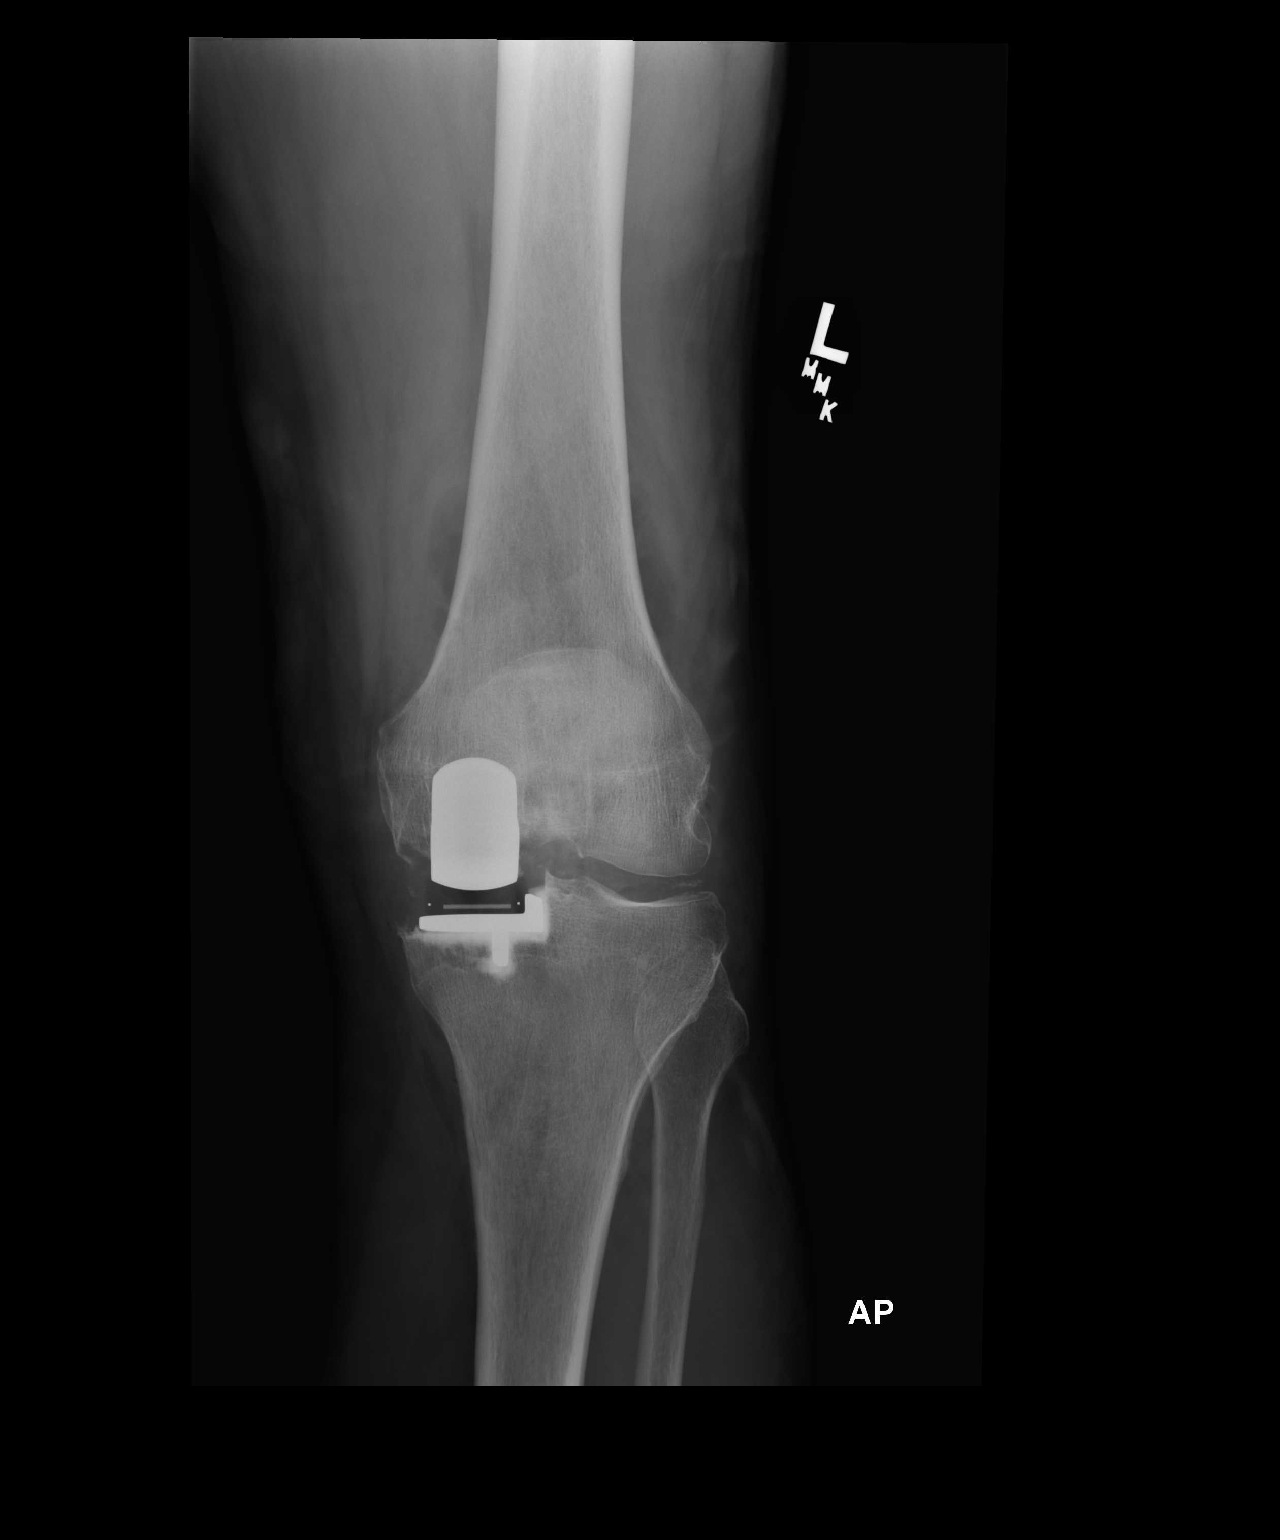

[xtable lateral]
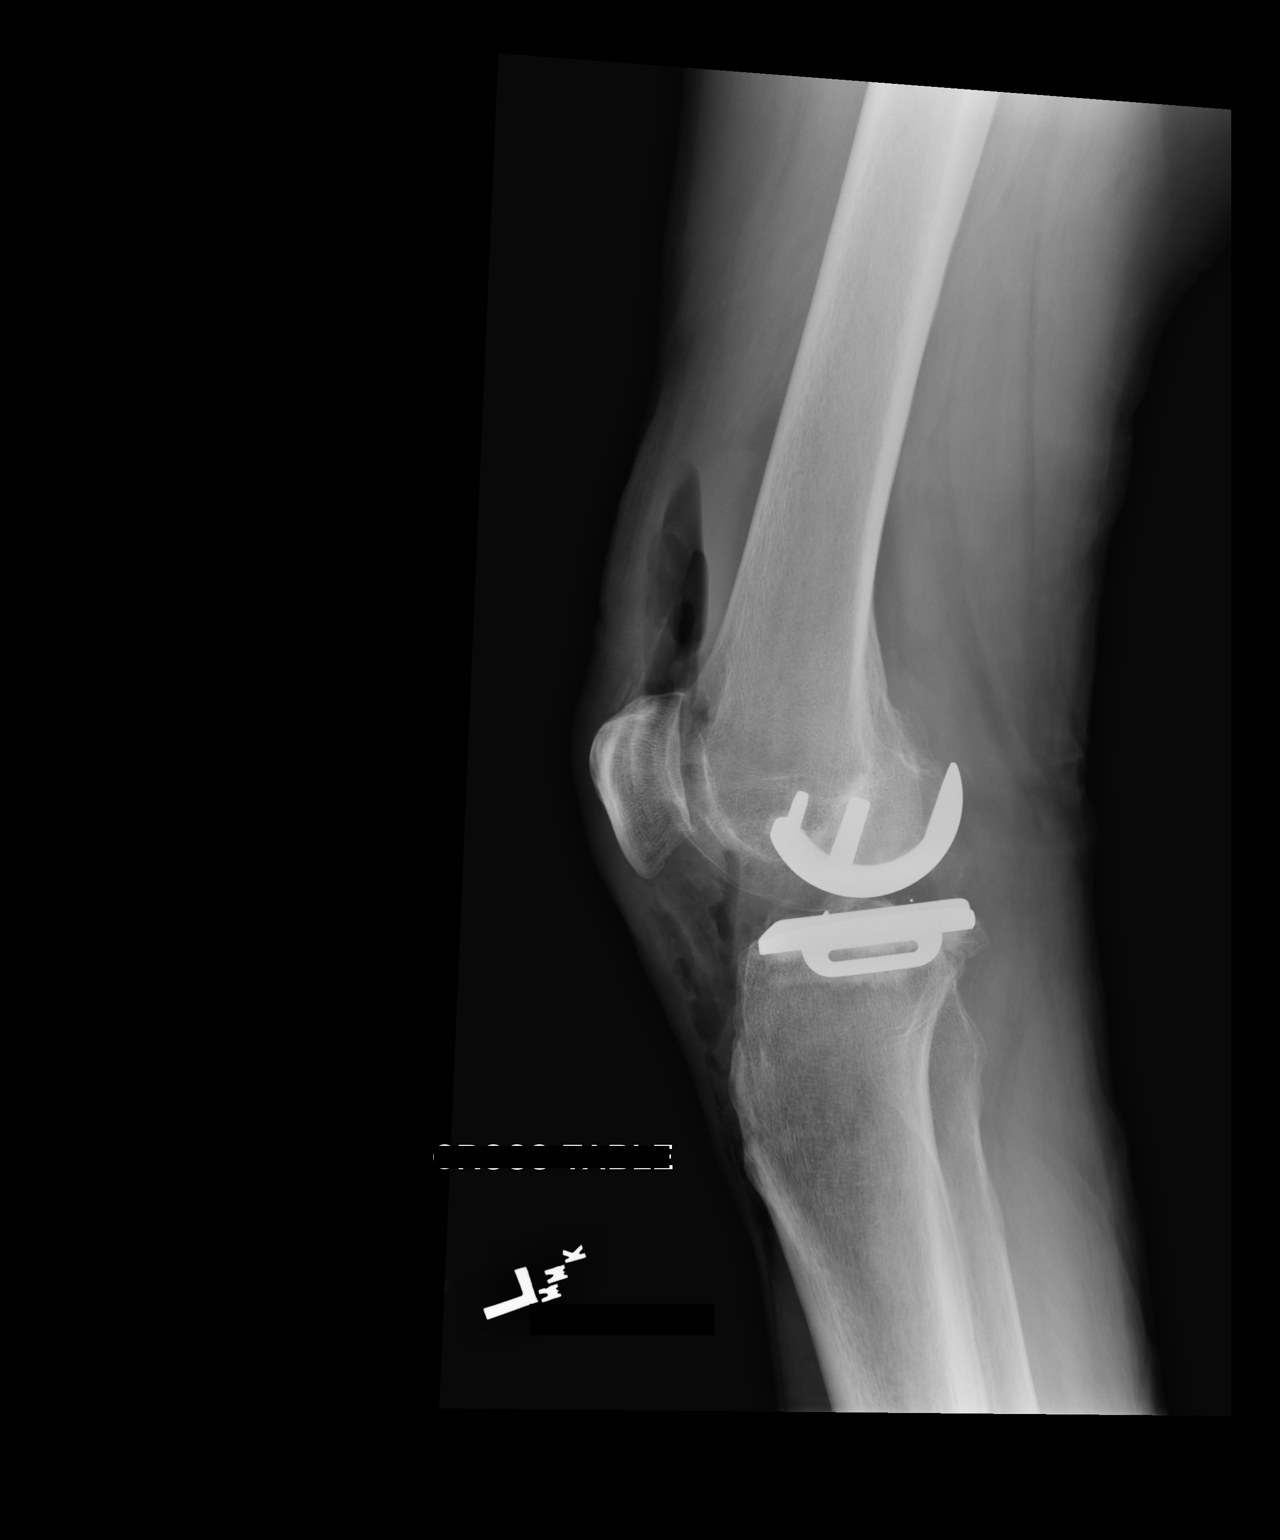

[2 of 2 positions shown; findings below may reference images not displayed]

FINDINGS: Interval left hemi medial femorotibial compartment arthroplasty
without failure or complication. Normal alignment. No acute fracture
or dislocation. Soft tissue emphysema in the surrounding soft
tissues. Chondrocalcinosis of the lateral femorotibial compartment
as can be seen with CPPD.
IMPRESSION: Interval left hemi medial femorotibial compartment arthroplasty
without failure or complication.

## 2018-05-12 ENCOUNTER — Other Ambulatory Visit (INDEPENDENT_AMBULATORY_CARE_PROVIDER_SITE_OTHER): Payer: Medicare PPO

## 2018-05-12 ENCOUNTER — Telehealth: Payer: Self-pay | Admitting: Endocrinology

## 2018-05-12 DIAGNOSIS — E1165 Type 2 diabetes mellitus with hyperglycemia: Secondary | ICD-10-CM | POA: Diagnosis not present

## 2018-05-12 LAB — COMPREHENSIVE METABOLIC PANEL
ALT: 20 U/L (ref 0–53)
AST: 17 U/L (ref 0–37)
Albumin: 4.2 g/dL (ref 3.5–5.2)
Alkaline Phosphatase: 44 U/L (ref 39–117)
BUN: 23 mg/dL (ref 6–23)
CO2: 28 mEq/L (ref 19–32)
Calcium: 9.5 mg/dL (ref 8.4–10.5)
Chloride: 105 mEq/L (ref 96–112)
Creatinine, Ser: 1.01 mg/dL (ref 0.40–1.50)
GFR: 72.42 mL/min (ref >60.00)
GLUCOSE: 134 mg/dL — AB (ref 70–99)
Potassium: 4.1 mEq/L (ref 3.5–5.1)
Sodium: 141 mEq/L (ref 135–145)
Total Bilirubin: 0.9 mg/dL (ref 0.2–1.2)
Total Protein: 6.3 g/dL (ref 6.0–8.3)

## 2018-05-12 LAB — HEMOGLOBIN A1C: Hgb A1c MFr Bld: 6.9 % — ABNORMAL HIGH (ref 4.6–6.5)

## 2018-05-12 NOTE — Telephone Encounter (Signed)
ok 

## 2018-05-12 NOTE — Telephone Encounter (Signed)
Patient came in today for his labs and needed to reschedule his appointment. Next available was 3/10, he would like to know if the labs would still be okay until then   Please advise

## 2018-05-12 NOTE — Telephone Encounter (Signed)
Do you want labs redrawn?

## 2018-05-13 NOTE — Telephone Encounter (Signed)
Called pt and advised him that MD stated that using previous labs was okay.

## 2018-05-16 ENCOUNTER — Ambulatory Visit: Payer: Medicare PPO | Admitting: Endocrinology

## 2018-06-03 ENCOUNTER — Ambulatory Visit: Payer: Medicare PPO | Admitting: Endocrinology

## 2018-06-03 ENCOUNTER — Encounter: Payer: Self-pay | Admitting: Endocrinology

## 2018-06-03 VITALS — BP 134/82 | HR 86 | Ht 72.0 in | Wt 188.4 lb

## 2018-06-03 DIAGNOSIS — E1165 Type 2 diabetes mellitus with hyperglycemia: Secondary | ICD-10-CM | POA: Diagnosis not present

## 2018-06-03 MED ORDER — SILDENAFIL CITRATE 100 MG PO TABS: 100.0000 mg | ORAL_TABLET | Freq: Every day | ORAL | 0 refills | Status: DC | PRN

## 2018-06-03 NOTE — Progress Notes (Signed)
  Patient ID: Juan Morales, male   DOB: 06/23/1945, 73 y.o.   MRN: [id number]   Reason for Appointment:  follow-up   History of Present Illness   Diagnosis: Type 2 DIABETES MELITUS  He was initially diagnosed with prediabetes in 2003. Previously had relatively mild diabetes which was previously well-controlled with low-dose Jentadueto or Janumet XR His A1c had been usually under 7%   When his A1c had gone up to 7.6% in 7/15 he was started on Trulicity  0.75 mg weekly.   He also continues to take 1500 mg of metformin   In 8/18 because of relatively high A1c and fasting sugars he was started on Amaryl 1 mg at dinnertime  A1c is 6.9, previous range 6.5-7.6  Current blood sugar patterns, daily management and problems identified:  He is again checking blood sugars only fasting and not after meals as directed  Despite his weight gain his A1c is slightly better  Also he has not been doing his usual walking during winter months  He does not understand that his A1c is affected by his postprandial readings  Also the last 2 or 3 days he has been eating sweets and his fasting readings have been going up  Taking his Trulicity regularly without side effects  No hypoglycemia with Amaryl  Side effects from medications:  none  Monitors blood glucose: Once a day or less.    Glucometer: Accucheck        Blood Glucose readings by download   FASTING blood sugar range 99-172 with average about 122 Dinnertime 118 AVERAGE 122  Physical activity: exercise: Walking 3 miles daily recently, intermittently during winter months   Wt Readings from Last 3 Encounters:  06/03/18 188 lb 6.4 oz (85.5 kg)  12/19/17 183 lb (83 kg)  09/19/17 187 lb (84.8 kg)   Lab Results  Component Value Date   HGBA1C 6.9 (H) 05/12/2018   HGBA1C 7.1 (H) 12/16/2017   HGBA1C 7.1 (H) 09/16/2017   Lab Results  Component Value Date   MICROALBUR 1.0 09/16/2017   LDLCALC 91 12/16/2017   CREATININE 1.01  05/12/2018    OTHER active problems: See review of systems  No visits with results within 1 Week(s) from this visit.  Latest known visit with results is:  Lab on 05/12/2018  Component Date Value Ref Range Status  . Sodium 05/12/2018 141  135 - 145 mEq/L Final  . Potassium 05/12/2018 4.1  3.5 - 5.1 mEq/L Final  . Chloride 05/12/2018 105  96 - 112 mEq/L Final  . CO2 05/12/2018 28  19 - 32 mEq/L Final  . Glucose, Bld 05/12/2018 134* 70 - 99 mg/dL Final  . BUN 05/12/2018 23  6 - 23 mg/dL Final  . Creatinine, Ser 05/12/2018 1.01  0.40 - 1.50 mg/dL Final  . Total Bilirubin 05/12/2018 0.9  0.2 - 1.2 mg/dL Final  . Alkaline Phosphatase 05/12/2018 44  39 - 117 U/L Final  . AST 05/12/2018 17  0 - 37 U/L Final  . ALT 05/12/2018 20  0 - 53 U/L Final  . Total Protein 05/12/2018 6.3  6.0 - 8.3 g/dL Final  . Albumin 05/12/2018 4.2  3.5 - 5.2 g/dL Final  . Calcium 05/12/2018 9.5  8.4 - 10.5 mg/dL Final  . GFR 05/12/2018 72.42  >60.00 mL/min Final  . Hgb A1c MFr Bld 05/12/2018 6.9* 4.6 - 6.5 % Final   Glycemic Control Guidelines for People with Diabetes:Non Diabetic:  <6%Goal of Therapy: <7%Additional Action Suggested:  >  8%     Allergies as of 06/03/2018   No Known Allergies     Medication List       Accurate as of June 03, 2018  3:20 PM. Always use your most recent med list.        Accu-Chek Nano SmartView w/Device Kit Use to check blood sugar once a day dx code E11.65   Accu-Chek SmartView test strip Generic drug:  glucose blood 1 each by Other route 3 (three) times daily. Use as instructed   calcium carbonate 1250 (500 Ca) MG tablet Commonly known as:  OS-CAL - dosed in mg of elemental calcium Take 1 tablet by mouth daily.   Chromium 1000 MCG Tabs Take 1 tablet by mouth daily.   Fish Oil 1000 MG Caps Take 1 capsule by mouth daily.   glimepiride 1 MG tablet Commonly known as:  AMARYL Take 1 tablet daily before supper   metFORMIN 750 MG 24 hr tablet Commonly known as:   GLUCOPHAGE-XR TAKE 1 TABLET TWICE DAILY   multivitamin with minerals tablet Take 1 tablet by mouth daily.   pravastatin 40 MG tablet Commonly known as:  PRAVACHOL TAKE 1 TABLET EVERY DAY   sildenafil 100 MG tablet Commonly known as:  Viagra Take 1 tablet (100 mg total) by mouth daily as needed for erectile dysfunction.   Trulicity 7.03 JK/0.9FG Sopn Generic drug:  Dulaglutide INJECT  0.75MG  (0.5ML) SUBCUTANEOUSLY EVERY WEEK   zinc gluconate 50 MG tablet Take 50 mg by mouth daily.       Allergies: No Known Allergies  Past Medical History:  Diagnosis Date  . Arthritis   . Diabetes mellitus without complication ([place] [facility])   . Erectile dysfunction   . Hyperlipidemia   . Macular degeneration     Past Surgical History:  Procedure Laterality Date  . arthroscopic knee Left    times 2  . EYE SURGERY Bilateral    cataract removal  . PARTIAL KNEE ARTHROPLASTY Left 07/26/2015   Procedure: LEFT UNICOMPARTMENTAL KNEE ARTHROPLASTY;  Surgeon: Mcarthur Rossetti, MD;  Location: [facility];  Service: Orthopedics;  Laterality: Left;    Family History  Problem Relation Age of Onset  . Diabetes Neg Hx   . Heart disease Neg Hx   . Stroke Neg Hx   . Hypertension Father     Social History:  reports that he has never smoked. He has never used smokeless tobacco. He reports that he does not drink alcohol or use drugs.  Review of Systems:  Has macular degeneration, has regular exams, need to get a copy of his reports  No history of hypertension  but his blood pressure has been high normal in the office in the past  HYPERLIPIDEMIA: The lipid abnormality consists of elevated LDL.  Has been treated with pravastatin, well controlled      Lab Results  Component Value Date   CHOL 161 12/16/2017   HDL 55.50 12/16/2017   LDLCALC 91 12/16/2017   LDLDIRECT 144.3 02/16/2014   TRIG 75.0 12/16/2017   CHOLHDL 3 12/16/2017    He has had long-standing erectile dysfunction,  has been  prescribed Viagra with good results He gets prescription from [place]      Examination:   BP 134/82 (BP Location: Left Arm, Patient Position: Sitting, Cuff Size: Normal)   Pulse 86   Ht 6' (1.829 m)   Wt 188 lb 6.4 oz (85.5 kg)   SpO2 99%   BMI 25.55 kg/m   Body mass index  is 25.55 kg/m.      ASSESSMENT/ PLAN:   Diabetes type 2 with BMI 25 See history of present illness for detailed discussion of current diabetes management, blood sugar patterns and problems identified  A1c is improved at 6.9  He is doing well with Trulicity, metformin and low-dose Amaryl Although he has gained weight his overall control appears to be fairly good Only recently has been starting to do walking Although most of the time he is doing well with the diet difficult to know if he has any postprandial hyperglycemia  Recently sugars have been higher from eating sweets he thinks he can do better He will start checking some readings after meals and alternate with fasting readings  LIPIDS: Hyperlipidemia: On statin drug, LDL to be checked again on the next visit  Follow-up in 4 months, again encouraged him to establish with a PCP for preventive care and regular preventive exams New prescription for Viagra given     Patient Instructions  Check blood sugars on waking up 3 days a week  Also check blood sugars about 2 hours after meals and do this after different meals by rotation  Recommended blood sugar levels on waking up are 90-130 and about 2 hours after meal is 130-160  Please bring your blood sugar monitor to each visit, thank you       Elayne Snare 06/03/2018, 3:20 PM

## 2018-06-03 NOTE — Patient Instructions (Signed)
Check blood sugars on waking up 3 days a week  Also check blood sugars about 2 hours after meals and do this after different meals by rotation  Recommended blood sugar levels on waking up are 90-130 and about 2 hours after meal is 130-160  Please bring your blood sugar monitor to each visit, thank you   

## 2018-06-10 ENCOUNTER — Other Ambulatory Visit: Payer: Self-pay

## 2018-06-10 MED ORDER — PRAVASTATIN SODIUM 40 MG PO TABS: 40.0000 mg | ORAL_TABLET | Freq: Every day | ORAL | 0 refills | Status: DC

## 2018-06-10 MED ORDER — DULAGLUTIDE 0.75 MG/0.5ML ~~LOC~~ SOAJ: SUBCUTANEOUS | 2 refills | Status: DC

## 2018-06-10 MED ORDER — METFORMIN HCL ER 750 MG PO TB24: 750.0000 mg | ORAL_TABLET | Freq: Two times a day (BID) | ORAL | 0 refills | Status: DC

## 2018-06-10 MED ORDER — GLUCOSE BLOOD VI STRP: 1.0000 | ORAL_STRIP | Freq: Three times a day (TID) | 3 refills | Status: DC

## 2018-06-19 ENCOUNTER — Other Ambulatory Visit: Payer: Self-pay

## 2018-06-19 MED ORDER — GLUCOSE BLOOD VI STRP: 1.0000 | ORAL_STRIP | Freq: Three times a day (TID) | 3 refills | Status: DC

## 2018-06-27 ENCOUNTER — Other Ambulatory Visit: Payer: Self-pay

## 2018-06-27 MED ORDER — GLUCOSE BLOOD VI STRP: ORAL_STRIP | 3 refills | Status: DC

## 2018-07-02 ENCOUNTER — Other Ambulatory Visit: Payer: Self-pay

## 2018-07-02 MED ORDER — GLUCOSE BLOOD VI STRP: ORAL_STRIP | 3 refills | Status: AC

## 2018-07-31 DIAGNOSIS — L578 Other skin changes due to chronic exposure to nonionizing radiation: Secondary | ICD-10-CM | POA: Diagnosis not present

## 2018-07-31 DIAGNOSIS — L570 Actinic keratosis: Secondary | ICD-10-CM | POA: Diagnosis not present

## 2018-08-07 ENCOUNTER — Other Ambulatory Visit: Payer: Self-pay | Admitting: Endocrinology

## 2018-10-06 ENCOUNTER — Other Ambulatory Visit: Payer: Medicare PPO

## 2018-10-09 ENCOUNTER — Ambulatory Visit: Payer: Medicare PPO | Admitting: Endocrinology

## 2018-10-13 ENCOUNTER — Ambulatory Visit: Payer: Medicare PPO | Admitting: Endocrinology

## 2018-10-16 ENCOUNTER — Ambulatory Visit: Payer: Medicare PPO | Admitting: Endocrinology

## 2018-10-16 DIAGNOSIS — Z0289 Encounter for other administrative examinations: Secondary | ICD-10-CM

## 2018-11-17 ENCOUNTER — Other Ambulatory Visit: Payer: Medicare PPO

## 2018-12-01 ENCOUNTER — Other Ambulatory Visit: Payer: Self-pay | Admitting: Endocrinology

## 2018-12-02 ENCOUNTER — Telehealth: Payer: Self-pay | Admitting: Endocrinology

## 2018-12-03 NOTE — Telephone Encounter (Signed)
LMTCB and reschedule missed appointment °

## 2018-12-03 NOTE — Telephone Encounter (Signed)
-----   Message from Elayne Snare, MD sent at 12/01/2018  9:34 PM EDT ----- Regarding: Appointment needed Please schedule follow-up for diabetes, labs before visit

## 2018-12-08 NOTE — Telephone Encounter (Signed)
LMTCB (x2) and schedule appt °

## 2018-12-10 ENCOUNTER — Encounter: Payer: Self-pay | Admitting: Endocrinology

## 2018-12-10 NOTE — Telephone Encounter (Signed)
LVM to Schedule x 3  ° °Mailing Letter °

## 2019-01-05 ENCOUNTER — Other Ambulatory Visit (INDEPENDENT_AMBULATORY_CARE_PROVIDER_SITE_OTHER): Payer: Medicare PPO

## 2019-01-05 ENCOUNTER — Other Ambulatory Visit: Payer: Self-pay

## 2019-01-05 DIAGNOSIS — E1165 Type 2 diabetes mellitus with hyperglycemia: Secondary | ICD-10-CM

## 2019-01-05 LAB — COMPREHENSIVE METABOLIC PANEL
ALT: 17 U/L (ref 0–53)
AST: 16 U/L (ref 0–37)
Albumin: 4.2 g/dL (ref 3.5–5.2)
Alkaline Phosphatase: 50 U/L (ref 39–117)
BUN: 22 mg/dL (ref 6–23)
CO2: 31 mEq/L (ref 19–32)
Calcium: 9.8 mg/dL (ref 8.4–10.5)
Chloride: 104 mEq/L (ref 96–112)
Creatinine, Ser: 0.98 mg/dL (ref 0.40–1.50)
GFR: 74.85 mL/min (ref >60.00)
Glucose, Bld: 181 mg/dL — ABNORMAL HIGH (ref 70–99)
Potassium: 4.9 mEq/L (ref 3.5–5.1)
Sodium: 141 mEq/L (ref 135–145)
Total Bilirubin: 0.8 mg/dL (ref 0.2–1.2)
Total Protein: 6.6 g/dL (ref 6.0–8.3)

## 2019-01-05 LAB — MICROALBUMIN / CREATININE URINE RATIO
Creatinine,U: 106 mg/dL
Microalb Creat Ratio: 1.4 mg/g (ref 0.0–30.0)
Microalb, Ur: 1.5 mg/dL (ref 0.0–1.9)

## 2019-01-05 LAB — LIPID PANEL
Cholesterol: 197 mg/dL (ref 0–200)
HDL: 61.5 mg/dL (ref >39.00)
LDL Cholesterol: 118 mg/dL — ABNORMAL HIGH (ref 0–99)
NonHDL: 135.58
Total CHOL/HDL Ratio: 3
Triglycerides: 87 mg/dL (ref 0.0–149.0)
VLDL: 17.4 mg/dL (ref 0.0–40.0)

## 2019-01-05 LAB — HEMOGLOBIN A1C: Hgb A1c MFr Bld: 7.7 % — ABNORMAL HIGH (ref 4.6–6.5)

## 2019-01-08 ENCOUNTER — Encounter: Payer: Self-pay | Admitting: Endocrinology

## 2019-01-08 ENCOUNTER — Ambulatory Visit: Payer: Medicare PPO | Admitting: Endocrinology

## 2019-01-08 ENCOUNTER — Other Ambulatory Visit: Payer: Self-pay

## 2019-01-08 VITALS — BP 140/78 | HR 86 | Ht 72.0 in | Wt 185.0 lb

## 2019-01-08 DIAGNOSIS — Z23 Encounter for immunization: Secondary | ICD-10-CM

## 2019-01-08 DIAGNOSIS — E1165 Type 2 diabetes mellitus with hyperglycemia: Secondary | ICD-10-CM | POA: Diagnosis not present

## 2019-01-08 DIAGNOSIS — E7800 Pure hypercholesterolemia, unspecified: Secondary | ICD-10-CM

## 2019-01-08 MED ORDER — PRAVASTATIN SODIUM 40 MG PO TABS: 40.0000 mg | ORAL_TABLET | Freq: Every day | ORAL | 1 refills | Status: DC

## 2019-01-08 NOTE — Patient Instructions (Signed)
Trulicity 1.5mg  weekly  Check blood sugars on waking up 2-3 days a week  Also check blood sugars about 2 hours after meals and do this after different meals by rotation  Recommended blood sugar levels on waking up are 90-130 and about 2 hours after meal is 130-160  Please bring your blood sugar monitor to each visit, thank you

## 2019-01-08 NOTE — Progress Notes (Signed)
Patient ID: Juan Morales, male   DOB: Feb 22, 1946, 73 y.o.   MRN: [id number]   Reason for Appointment:  follow-up   History of Present Illness   Diagnosis: Type 2 DIABETES MELITUS  He was initially diagnosed with prediabetes in 2003. Previously had relatively mild diabetes which was previously well-controlled with low-dose Jentadueto or Janumet XR His A1c had been usually under 7%   When his A1c had gone up to 7.6% in 7/15 he was started on Trulicity  2.03 mg weekly.   In 8/18 because of relatively high A1c and fasting sugars he was started on Amaryl 1 mg at dinnertime  RECENT HISTORY:  Last visit was in 3/20  Current medication regimen: Trulicity 5.59 mg weekly, Amaryl 1 mg at dinner and Metformin ER 1500 mg daily  Current blood sugar patterns, daily management and problems identified:  His A1c is relatively higher at 7.7 compared to 6.9   He did not bring his meter for download today  Before he is is again checking blood sugars only fasting, does not do any readings after meals  He thinks his blood sugars are higher because for up to 3 months he had not been doing any walking because of his wife's hip surgery  However even with walking up to 3 miles a day over the last 3 to 4 weeks his fasting blood sugar in the lab is still 181  He thinks his blood sugars are much lower at home and only around 130 fasting recently  Not clear if his home meter is accurate  His weight is down 3 pounds  Generally not being consistent with diet with sometimes higher fat on carbohydrate meals or snacks  Side effects from medications:  none  Monitors blood glucose: Once a day or less.    Glucometer: Accucheck   guide      Blood Glucose readings by recall  Overall 130-140 fasting  PREVIOUSLY: FASTING blood sugar range 99-172 with average about 122 Dinnertime 118 AVERAGE 122  Physical activity: exercise: Walking 3 miles daily     Wt Readings from Last 3 Encounters:   01/08/19 185 lb (83.9 kg)  06/03/18 188 lb 6.4 oz (85.5 kg)  12/19/17 183 lb (83 kg)   Lab Results  Component Value Date   HGBA1C 7.7 (H) 01/05/2019   HGBA1C 6.9 (H) 05/12/2018   HGBA1C 7.1 (H) 12/16/2017   Lab Results  Component Value Date   MICROALBUR 1.5 01/05/2019   LDLCALC 118 (H) 01/05/2019   CREATININE 0.98 01/05/2019    OTHER active problems: See review of systems  Lab on 01/05/2019  Component Date Value Ref Range Status  . Cholesterol 01/05/2019 197  0 - 200 mg/dL Final   ATP III Classification       Desirable:  < 200 mg/dL               Borderline High:  200 - 239 mg/dL          High:  > = 240 mg/dL  . Triglycerides 01/05/2019 87.0  0.0 - 149.0 mg/dL Final   Normal:  <150 mg/dLBorderline High:  150 - 199 mg/dL  . HDL 01/05/2019 61.50  >39.00 mg/dL Final  . VLDL 01/05/2019 17.4  0.0 - 40.0 mg/dL Final  . LDL Cholesterol 01/05/2019 118* 0 - 99 mg/dL Final  . Total CHOL/HDL Ratio 01/05/2019 3   Final  Men          Women1/2 Average Risk     3.4          3.3Average Risk          5.0          4.42X Average Risk          9.6          7.13X Average Risk          15.0          11.0                      . NonHDL 01/05/2019 135.58   Final   NOTE:  Non-HDL goal should be 30 mg/dL higher than patient's LDL goal (i.e. LDL goal of < 70 mg/dL, would have non-HDL goal of < 100 mg/dL)  . Microalb, Ur 01/05/2019 1.5  0.0 - 1.9 mg/dL Final  . Creatinine,U 01/05/2019 106.0  mg/dL Final  . Microalb Creat Ratio 01/05/2019 1.4  0.0 - 30.0 mg/g Final  . Sodium 01/05/2019 141  135 - 145 mEq/L Final  . Potassium 01/05/2019 4.9  3.5 - 5.1 mEq/L Final  . Chloride 01/05/2019 104  96 - 112 mEq/L Final  . CO2 01/05/2019 31  19 - 32 mEq/L Final  . Glucose, Bld 01/05/2019 181* 70 - 99 mg/dL Final  . BUN 01/05/2019 22  6 - 23 mg/dL Final  . Creatinine, Ser 01/05/2019 0.98  0.40 - 1.50 mg/dL Final  . Total Bilirubin 01/05/2019 0.8  0.2 - 1.2 mg/dL Final  . Alkaline Phosphatase  01/05/2019 50  39 - 117 U/L Final  . AST 01/05/2019 16  0 - 37 U/L Final  . ALT 01/05/2019 17  0 - 53 U/L Final  . Total Protein 01/05/2019 6.6  6.0 - 8.3 g/dL Final  . Albumin 01/05/2019 4.2  3.5 - 5.2 g/dL Final  . Calcium 01/05/2019 9.8  8.4 - 10.5 mg/dL Final  . GFR 01/05/2019 74.85  >60.00 mL/min Final  . Hgb A1c MFr Bld 01/05/2019 7.7* 4.6 - 6.5 % Final   Glycemic Control Guidelines for People with Diabetes:Non Diabetic:  <6%Goal of Therapy: <7%Additional Action Suggested:  >8%     Allergies as of 01/08/2019   No Known Allergies     Medication List       Accurate as of January 08, 2019 11:59 PM. If you have any questions, ask your nurse or doctor.        STOP taking these medications   Accu-Chek Nano SmartView w/Device Kit Stopped by: Elayne Snare, MD   Fish Oil 1000 MG Caps Stopped by: Elayne Snare, MD     TAKE these medications   calcium carbonate 1250 (500 Ca) MG tablet Commonly known as: OS-CAL - dosed in mg of elemental calcium Take 1 tablet by mouth daily.   Chromium 1000 MCG Tabs Take 1 tablet by mouth daily.   Dulaglutide 0.75 MG/0.5ML Sopn Commonly known as: Trulicity Inject 0.5 ml weekly   glimepiride 1 MG tablet Commonly known as: AMARYL TAKE 1 TABLET DAILY BEFORE SUPPER   glucose blood test strip Commonly known as: Accu-Chek Guide Use accu chek guide test strips as instructed to check blood sugar three times daily.   metFORMIN 750 MG 24 hr tablet Commonly known as: GLUCOPHAGE-XR TAKE 1 TABLET TWICE DAILY   multivitamin with minerals tablet Take 1 tablet by mouth daily.   pravastatin 40 MG tablet Commonly known  as: PRAVACHOL Take 1 tablet (40 mg total) by mouth daily.   sildenafil 100 MG tablet Commonly known as: Viagra Take 1 tablet (100 mg total) by mouth daily as needed for erectile dysfunction.   zinc gluconate 50 MG tablet Take 50 mg by mouth daily.       Allergies: No Known Allergies  Past Medical History:  Diagnosis Date   . Arthritis   . Diabetes mellitus without complication ([place])   . Erectile dysfunction   . Hyperlipidemia   . Macular degeneration     Past Surgical History:  Procedure Laterality Date  . arthroscopic knee Left    times 2  . EYE SURGERY Bilateral    cataract removal  . PARTIAL KNEE ARTHROPLASTY Left 07/26/2015   Procedure: LEFT UNICOMPARTMENTAL KNEE ARTHROPLASTY;  Surgeon: Mcarthur Rossetti, MD;  Location: [facility];  Service: Orthopedics;  Laterality: Left;    Family History  Problem Relation Age of Onset  . Diabetes Neg Hx   . Heart disease Neg Hx   . Stroke Neg Hx   . Hypertension Father     Social History:  reports that he has never smoked. He has never used smokeless tobacco. He reports that he does not drink alcohol or use drugs.  Review of Systems:  Has macular degeneration, has regular exams,   No history of hypertension  HYPERLIPIDEMIA: The lipid abnormality consists of elevated LDL .  Has been treated with pravastatin, 40 mg Diet has not been as good in the last 3 to 4 months and LDL is higher than usual  Lab Results  Component Value Date   CHOL 197 01/05/2019   CHOL 161 12/16/2017   CHOL 214 (H) 09/16/2017   Lab Results  Component Value Date   HDL 61.50 01/05/2019   HDL 55.50 12/16/2017   HDL 58.20 09/16/2017   Lab Results  Component Value Date   LDLCALC 118 (H) 01/05/2019   LDLCALC 91 12/16/2017   LDLCALC 137 (H) 09/16/2017   Lab Results  Component Value Date   TRIG 87.0 01/05/2019   TRIG 75.0 12/16/2017   TRIG 95.0 09/16/2017   Lab Results  Component Value Date   CHOLHDL 3 01/05/2019   CHOLHDL 3 12/16/2017   CHOLHDL 4 09/16/2017   Lab Results  Component Value Date   LDLDIRECT 144.3 02/16/2014   LDLDIRECT 151.2 02/23/2013   LDLDIRECT 138.1 10/22/2012     He has had long-standing erectile dysfunction,  has been prescribed Viagra with good results He gets his prescription from [place]      Examination:   BP 140/78 (BP Location:  Left Arm, Patient Position: Sitting, Cuff Size: Normal)   Pulse 86   Ht 6' (1.829 m)   Wt 185 lb (83.9 kg)   SpO2 99%   BMI 25.09 kg/m   Body mass index is 25.09 kg/m.      ASSESSMENT/ PLAN:   Diabetes type 2 with BMI 25 See history of present illness for detailed discussion of current diabetes management, blood sugar patterns and problems identified  A1c is higher than usual at 7.7, previously at 6.9  He is taking Trulicity 1.61 mg weekly, metformin and low-dose Amaryl Although he has not done his exercise with walking as before he still appears to have high readings in the last month when he started back walking Not clear if his fasting readings at home are accurate as blood sugar in the lab was 181  He may be getting some progression of the diabetes  also He agrees to try 1.5 mg Trulicity weekly He can double up on the prescription he has at home and if tolerated he will call for a new prescription Also can try to get 30-day supply from the drugstore for his next prescription and coupon given  Emphasized the need to cut back on carbohydrates and fats He will check readings after meals also  Hyperlipidemia: On statin drug, LDL 118 recently Previously below 100 Given him a list of high saturated fat foods to avoid LDL will need to be checked again on the next visit  Influenza vaccine given     Patient Instructions  Trulicity 1.3KG weekly  Check blood sugars on waking up 2-3 days a week  Also check blood sugars about 2 hours after meals and do this after different meals by rotation  Recommended blood sugar levels on waking up are 90-130 and about 2 hours after meal is 130-160  Please bring your blood sugar monitor to each visit, thank you        Elayne Snare 01/09/2019, 10:09 AM

## 2019-02-09 ENCOUNTER — Other Ambulatory Visit: Payer: Self-pay

## 2019-02-09 MED ORDER — TRULICITY 0.75 MG/0.5ML ~~LOC~~ SOAJ: SUBCUTANEOUS | 2 refills | Status: DC

## 2019-02-10 ENCOUNTER — Other Ambulatory Visit: Payer: Self-pay

## 2019-02-10 MED ORDER — TRULICITY 1.5 MG/0.5ML ~~LOC~~ SOAJ: 1.5000 mg | SUBCUTANEOUS | 1 refills | Status: DC

## 2019-03-09 ENCOUNTER — Other Ambulatory Visit: Payer: Self-pay

## 2019-03-09 MED ORDER — GLIMEPIRIDE 1 MG PO TABS: ORAL_TABLET | ORAL | 1 refills | Status: DC

## 2019-04-20 ENCOUNTER — Other Ambulatory Visit: Payer: Self-pay

## 2019-04-20 ENCOUNTER — Other Ambulatory Visit (INDEPENDENT_AMBULATORY_CARE_PROVIDER_SITE_OTHER): Payer: Medicare PPO

## 2019-04-20 DIAGNOSIS — E1165 Type 2 diabetes mellitus with hyperglycemia: Secondary | ICD-10-CM

## 2019-04-20 DIAGNOSIS — E7800 Pure hypercholesterolemia, unspecified: Secondary | ICD-10-CM

## 2019-04-20 LAB — LIPID PANEL
Cholesterol: 156 mg/dL (ref 0–200)
HDL: 57 mg/dL (ref >39.00)
LDL Cholesterol: 87 mg/dL (ref 0–99)
NonHDL: 98.8
Total CHOL/HDL Ratio: 3
Triglycerides: 57 mg/dL (ref 0.0–149.0)
VLDL: 11.4 mg/dL (ref 0.0–40.0)

## 2019-04-20 LAB — COMPREHENSIVE METABOLIC PANEL
ALT: 16 U/L (ref 0–53)
AST: 16 U/L (ref 0–37)
Albumin: 4.1 g/dL (ref 3.5–5.2)
Alkaline Phosphatase: 51 U/L (ref 39–117)
BUN: 23 mg/dL (ref 6–23)
CO2: 30 mEq/L (ref 19–32)
Calcium: 9.8 mg/dL (ref 8.4–10.5)
Chloride: 105 mEq/L (ref 96–112)
Creatinine, Ser: 0.92 mg/dL (ref 0.40–1.50)
GFR: 80.45 mL/min (ref >60.00)
Glucose, Bld: 168 mg/dL — ABNORMAL HIGH (ref 70–99)
Potassium: 4.6 mEq/L (ref 3.5–5.1)
Sodium: 141 mEq/L (ref 135–145)
Total Bilirubin: 0.5 mg/dL (ref 0.2–1.2)
Total Protein: 6.5 g/dL (ref 6.0–8.3)

## 2019-04-20 LAB — HEMOGLOBIN A1C: Hgb A1c MFr Bld: 6.8 % — ABNORMAL HIGH (ref 4.6–6.5)

## 2019-04-23 ENCOUNTER — Encounter: Payer: Self-pay | Admitting: Endocrinology

## 2019-04-23 ENCOUNTER — Other Ambulatory Visit: Payer: Self-pay

## 2019-04-23 ENCOUNTER — Ambulatory Visit: Payer: Medicare PPO | Admitting: Endocrinology

## 2019-04-23 VITALS — BP 130/80 | HR 79 | Ht 72.0 in | Wt 186.2 lb

## 2019-04-23 DIAGNOSIS — E119 Type 2 diabetes mellitus without complications: Secondary | ICD-10-CM | POA: Diagnosis not present

## 2019-04-23 DIAGNOSIS — E7800 Pure hypercholesterolemia, unspecified: Secondary | ICD-10-CM

## 2019-04-23 MED ORDER — METFORMIN HCL ER 750 MG PO TB24: 750.0000 mg | ORAL_TABLET | Freq: Two times a day (BID) | ORAL | 1 refills | Status: DC

## 2019-04-23 MED ORDER — SILDENAFIL CITRATE 100 MG PO TABS: 100.0000 mg | ORAL_TABLET | Freq: Every day | ORAL | 0 refills | Status: DC | PRN

## 2019-04-23 NOTE — Progress Notes (Signed)
Patient ID: Juan Morales, male   DOB: [birth year], 74 y.o.   MRN: [id number]   Reason for Appointment:  follow-up   History of Present Illness   Diagnosis: Type 2 DIABETES MELITUS  He was initially diagnosed with prediabetes in 2003. Previously had relatively mild diabetes which was previously well-controlled with low-dose Jentadueto or Janumet XR His A1c had been usually under 7%   When his A1c had gone up to 7.6% in 7/15 he was started on Trulicity  0.75 mg weekly.   In 8/18 because of relatively high A1c and fasting sugars he was started on Amaryl 1 mg at dinnertime  RECENT HISTORY:  Last visit was in 12/2018  Current medication regimen: Trulicity 1.5 mg weekly, Amaryl 1 mg at dinner and Metformin ER 1500 mg daily  Current blood sugar patterns, daily management and problems identified:  His A1c on his last visit was  relatively higher at 7.7 compared to 6.9 A1c is now 6.8   He did bring his meter for download today  As usual he is again checking blood sugars mostly fasting and does not understand the importance of checking readings after meals  Because of poor control his Trulicity was increased to 1.5 mg weekly  He has no nausea with this  He did not think that this is improving his satiety level  He has also tried to increase his walking more regularly  Weight is about the same  Although his diet is inconsistent somewhat better than before, he says his wife tries to help him with a healthier diet  Not eating out as much  Has not had any hypoglycemia all the lowest reading was 80 before dinnertime  Side effects from medications:  none  Monitors blood glucose: Once a day or less.    Glucometer: Accucheck   guide      Blood Glucose readings by DOWNLOAD:  FASTING range 106-149 with average 121, dinnertime 80  Physical activity: exercise: Walking 3-5 miles daily     Wt Readings from Last 3 Encounters:  04/23/19 186 lb 3.2 oz (84.5 kg)  01/08/19 185  lb (83.9 kg)  06/03/18 188 lb 6.4 oz (85.5 kg)   Lab Results  Component Value Date   HGBA1C 6.8 (H) 04/20/2019   HGBA1C 7.7 (H) 01/05/2019   HGBA1C 6.9 (H) 05/12/2018   Lab Results  Component Value Date   MICROALBUR 1.5 01/05/2019   LDLCALC 87 04/20/2019   CREATININE 0.92 04/20/2019    OTHER active problems: See review of systems  Lab on 04/20/2019  Component Date Value Ref Range Status  . Cholesterol 04/20/2019 156  0 - 200 mg/dL Final   ATP III Classification       Desirable:  < 200 mg/dL               Borderline High:  200 - 239 mg/dL          High:  > = 884 mg/dL  . Triglycerides 04/20/2019 57.0  0.0 - 149.0 mg/dL Final   Normal:  <166 mg/dLBorderline High:  150 - 199 mg/dL  . HDL 04/20/2019 57.00  >39.00 mg/dL Final  . VLDL 09/23/1599 11.4  0.0 - 40.0 mg/dL Final  . LDL Cholesterol 04/20/2019 87  0 - 99 mg/dL Final  . Total CHOL/HDL Ratio 04/20/2019 3   Final                  Men  Women1/2 Average Risk     3.4          3.3Average Risk          5.0          4.42X Average Risk          9.6          7.13X Average Risk          15.0          11.0                      . NonHDL 04/20/2019 98.80   Final   NOTE:  Non-HDL goal should be 30 mg/dL higher than patient's LDL goal (i.e. LDL goal of < 70 mg/dL, would have non-HDL goal of < 100 mg/dL)  . Sodium 04/20/2019 141  135 - 145 mEq/L Final  . Potassium 04/20/2019 4.6  3.5 - 5.1 mEq/L Final  . Chloride 04/20/2019 105  96 - 112 mEq/L Final  . CO2 04/20/2019 30  19 - 32 mEq/L Final  . Glucose, Bld 04/20/2019 168* 70 - 99 mg/dL Final  . BUN 04/20/2019 23  6 - 23 mg/dL Final  . Creatinine, Ser 04/20/2019 0.92  0.40 - 1.50 mg/dL Final  . Total Bilirubin 04/20/2019 0.5  0.2 - 1.2 mg/dL Final  . Alkaline Phosphatase 04/20/2019 51  39 - 117 U/L Final  . AST 04/20/2019 16  0 - 37 U/L Final  . ALT 04/20/2019 16  0 - 53 U/L Final  . Total Protein 04/20/2019 6.5  6.0 - 8.3 g/dL Final  . Albumin 04/20/2019 4.1  3.5 - 5.2 g/dL Final   . GFR 04/20/2019 80.45  >60.00 mL/min Final  . Calcium 04/20/2019 9.8  8.4 - 10.5 mg/dL Final  . Hgb A1c MFr Bld 04/20/2019 6.8* 4.6 - 6.5 % Final   Glycemic Control Guidelines for People with Diabetes:Non Diabetic:  <6%Goal of Therapy: <7%Additional Action Suggested:  >8%     Allergies as of 04/23/2019   No Known Allergies     Medication List       Accurate as of April 23, 2019 11:59 PM. If you have any questions, ask your nurse or doctor.        calcium carbonate 1250 (500 Ca) MG tablet Commonly known as: OS-CAL - dosed in mg of elemental calcium Take 1 tablet by mouth daily.   Chromium 1000 MCG Tabs Take 1 tablet by mouth daily.   glimepiride 1 MG tablet Commonly known as: AMARYL Take 1 tablet by mouth daily before supper.   glucose blood test strip Commonly known as: Accu-Chek Guide Use accu chek guide test strips as instructed to check blood sugar three times daily.   metFORMIN 750 MG 24 hr tablet Commonly known as: GLUCOPHAGE-XR Take 1 tablet (750 mg total) by mouth 2 (two) times daily.   multivitamin with minerals tablet Take 1 tablet by mouth daily.   pravastatin 40 MG tablet Commonly known as: PRAVACHOL Take 1 tablet (40 mg total) by mouth daily.   sildenafil 100 MG tablet Commonly known as: Viagra Take 1 tablet (100 mg total) by mouth daily as needed for erectile dysfunction.   Trulicity 1.5 OZ/3.0QM Sopn Generic drug: Dulaglutide Inject 1.5 mg into the skin once a week. Inject 1.5mg  under the skin once weekly.   zinc gluconate 50 MG tablet Take 50 mg by mouth daily.       Allergies: No Known Allergies  Past  Medical History:  Diagnosis Date  . Arthritis   . Diabetes mellitus without complication ([facility])   . Erectile dysfunction   . Hyperlipidemia   . Macular degeneration     Past Surgical History:  Procedure Laterality Date  . arthroscopic knee Left    times 2  . EYE SURGERY Bilateral    cataract removal  . PARTIAL KNEE ARTHROPLASTY  Left 07/26/2015   Procedure: LEFT UNICOMPARTMENTAL KNEE ARTHROPLASTY;  Surgeon: Kathryne Hitch, MD;  Location: [facility] OR;  Service: Orthopedics;  Laterality: Left;    Family History  Problem Relation Age of Onset  . Diabetes Neg Hx   . Heart disease Neg Hx   . Stroke Neg Hx   . Hypertension Father     Social History:  reports that he has never smoked. He has never used smokeless tobacco. He reports that he does not drink alcohol or use drugs.  Review of Systems:  Has macular degeneration, is overdue for his follow-up  No history of hypertension  HYPERLIPIDEMIA: The lipid abnormality consists of elevated LDL .  Has been treated with pravastatin, 40 mg which he is taking regularly  Diet has been a little variable but lipids are much better compared to 10/20 without change in dosage   Lab Results  Component Value Date   CHOL 156 04/20/2019   CHOL 197 01/05/2019   CHOL 161 12/16/2017   Lab Results  Component Value Date   HDL 57.00 04/20/2019   HDL 61.50 01/05/2019   HDL 55.50 12/16/2017   Lab Results  Component Value Date   LDLCALC 87 04/20/2019   LDLCALC 118 (H) 01/05/2019   LDLCALC 91 12/16/2017   Lab Results  Component Value Date   TRIG 57.0 04/20/2019   TRIG 87.0 01/05/2019   TRIG 75.0 12/16/2017   Lab Results  Component Value Date   CHOLHDL 3 04/20/2019   CHOLHDL 3 01/05/2019   CHOLHDL 3 12/16/2017   Lab Results  Component Value Date   LDLDIRECT 144.3 02/16/2014   LDLDIRECT 151.2 02/23/2013   LDLDIRECT 138.1 10/22/2012     He has had long-standing erectile dysfunction,  has been prescribed Viagra with good results He gets his prescription from [place], new prescription given today      Examination:   BP 130/80 (BP Location: Left Arm, Patient Position: Sitting, Cuff Size: Normal)   Pulse 79   Ht 6' (1.829 m)   Wt 186 lb 3.2 oz (84.5 kg)   SpO2 96%   BMI 25.25 kg/m   Body mass index is 25.25 kg/m.     Diabetic Foot Exam - Simple    Simple Foot Form Diabetic Foot exam was performed with the following findings: Yes   Visual Inspection No deformities, no ulcerations, no other skin breakdown bilaterally: Yes Sensation Testing Intact to touch and monofilament testing bilaterally: Yes Pulse Check Posterior Tibialis and Dorsalis pulse intact bilaterally: Yes Comments      ASSESSMENT/ PLAN:   Diabetes type 2 with BMI 25 See history of present illness for detailed discussion of current diabetes management, blood sugar patterns and problems identified  A1c is improved at 6.8  With increasing Trulicity up to 1.5 mg his A1c has come down nearly 1%  He is likely doing better with his diet overall also compared to his last visit Also has been able to mostly increase his exercise with walking 40 miles a week  Blood sugars in the morning are averaging about 120 Discussed the importance of checking readings  after meals to help adjust his diet depending on his blood sugar results Also explained that his readings after meals contribute to his A1c Medications will be continued unchanged and recommended checking readings alternating fasting and 2-3 hours after meals  Hyperlipidemia: On statin drug, LDL 118 previously This is now below 100 Has been given a list of saturated fat foods to reduce and he now has better results likely with changing diet  Longstanding erectile dysfunction likely related to diabetes: Viagra prescription refilled  Encouraged him to make an appointment with his eye doctor  He is planning to get his Covid vaccine soon  There are no Patient Instructions on file for this visit.    Reather Littler 04/24/2019, 8:30 AM

## 2019-05-09 ENCOUNTER — Ambulatory Visit: Payer: Medicare PPO | Attending: Internal Medicine

## 2019-05-09 DIAGNOSIS — Z23 Encounter for immunization: Secondary | ICD-10-CM

## 2019-05-09 NOTE — Progress Notes (Signed)
   Covid-19 Vaccination Clinic  Name:  FINLEE MILO    MRN: [id number] DOB: 1945/09/20  05/09/2019  Mr. Sainato was observed post Covid-19 immunization for 15 minutes without incidence. He was provided with Vaccine Information Sheet and instruction to access the V-Safe system.   Mr. Colt was instructed to call 911 with any severe reactions post vaccine: Marland Kitchen Difficulty breathing  . Swelling of your face and throat  . A fast heartbeat  . A bad rash all over your body  . Dizziness and weakness    Immunizations Administered    Name Date Dose VIS Date Route   Pfizer COVID-19 Vaccine 05/09/2019  2:20 PM 0.3 mL 03/06/2019 Intramuscular   Manufacturer: [employer], [employer]   Lot: [id number]   NDC: [address]-3

## 2019-06-01 ENCOUNTER — Ambulatory Visit: Payer: Medicare PPO | Attending: Internal Medicine

## 2019-06-01 DIAGNOSIS — Z23 Encounter for immunization: Secondary | ICD-10-CM | POA: Insufficient documentation

## 2019-06-01 NOTE — Progress Notes (Signed)
   Covid-19 Vaccination Clinic  Name:  Juan Morales    MRN: [id number] DOB: May 17, 1945  06/01/2019  Juan Morales was observed post Covid-19 immunization for 15 minutes without incident. He was provided with Vaccine Information Sheet and instruction to access the V-Safe system.   Juan Morales was instructed to call 911 with any severe reactions post vaccine: Marland Kitchen Difficulty breathing  . Swelling of face and throat  . A fast heartbeat  . A bad rash all over body  . Dizziness and weakness   Immunizations Administered    Name Date Dose VIS Date Route   Pfizer COVID-19 Vaccine 06/01/2019 12:15 PM 0.3 mL 03/06/2019 Intramuscular   Manufacturer: [employer], [employer]   Lot: [id number]   NDC: [address]-4

## 2019-06-22 ENCOUNTER — Other Ambulatory Visit: Payer: Self-pay

## 2019-06-22 MED ORDER — TRULICITY 1.5 MG/0.5ML ~~LOC~~ SOAJ: 1.5000 mg | SUBCUTANEOUS | 1 refills | Status: DC

## 2019-06-22 MED ORDER — METFORMIN HCL ER 750 MG PO TB24: 750.0000 mg | ORAL_TABLET | Freq: Two times a day (BID) | ORAL | 1 refills | Status: DC

## 2019-06-22 MED ORDER — PRAVASTATIN SODIUM 40 MG PO TABS: 40.0000 mg | ORAL_TABLET | Freq: Every day | ORAL | 1 refills | Status: DC

## 2019-06-23 ENCOUNTER — Other Ambulatory Visit: Payer: Self-pay | Admitting: Endocrinology

## 2019-08-21 ENCOUNTER — Other Ambulatory Visit: Payer: Self-pay

## 2019-08-21 MED ORDER — GLIMEPIRIDE 1 MG PO TABS: ORAL_TABLET | ORAL | 1 refills | Status: DC

## 2019-08-23 ENCOUNTER — Other Ambulatory Visit: Payer: Self-pay | Admitting: Endocrinology

## 2019-08-26 ENCOUNTER — Other Ambulatory Visit: Payer: Self-pay

## 2019-08-26 MED ORDER — GLIMEPIRIDE 1 MG PO TABS: ORAL_TABLET | ORAL | 1 refills | Status: DC

## 2019-10-19 ENCOUNTER — Other Ambulatory Visit: Payer: Medicare PPO

## 2019-10-22 ENCOUNTER — Ambulatory Visit: Payer: Medicare PPO | Admitting: Endocrinology

## 2019-11-17 ENCOUNTER — Other Ambulatory Visit: Payer: Medicare PPO

## 2019-11-19 ENCOUNTER — Ambulatory Visit: Payer: Medicare PPO | Admitting: Endocrinology

## 2019-12-18 DIAGNOSIS — H31093 Other chorioretinal scars, bilateral: Secondary | ICD-10-CM | POA: Diagnosis not present

## 2019-12-18 DIAGNOSIS — H353121 Nonexudative age-related macular degeneration, left eye, early dry stage: Secondary | ICD-10-CM | POA: Diagnosis not present

## 2019-12-18 DIAGNOSIS — H35373 Puckering of macula, bilateral: Secondary | ICD-10-CM | POA: Diagnosis not present

## 2019-12-18 DIAGNOSIS — H353212 Exudative age-related macular degeneration, right eye, with inactive choroidal neovascularization: Secondary | ICD-10-CM | POA: Diagnosis not present

## 2019-12-18 LAB — HM DIABETES EYE EXAM

## 2019-12-21 ENCOUNTER — Other Ambulatory Visit: Payer: Medicare PPO

## 2019-12-24 ENCOUNTER — Ambulatory Visit: Payer: Medicare PPO | Admitting: Endocrinology

## 2020-01-05 ENCOUNTER — Other Ambulatory Visit: Payer: Self-pay | Admitting: Endocrinology

## 2020-01-05 MED ORDER — SILDENAFIL CITRATE 100 MG PO TABS: 100.0000 mg | ORAL_TABLET | Freq: Every day | ORAL | 0 refills | Status: DC | PRN

## 2020-01-14 ENCOUNTER — Ambulatory Visit: Payer: Medicare PPO | Admitting: Endocrinology

## 2020-01-18 ENCOUNTER — Other Ambulatory Visit: Payer: Medicare PPO

## 2020-01-25 ENCOUNTER — Other Ambulatory Visit: Payer: Self-pay

## 2020-01-25 ENCOUNTER — Other Ambulatory Visit (INDEPENDENT_AMBULATORY_CARE_PROVIDER_SITE_OTHER): Payer: Medicare PPO

## 2020-01-25 DIAGNOSIS — E119 Type 2 diabetes mellitus without complications: Secondary | ICD-10-CM | POA: Diagnosis not present

## 2020-01-25 DIAGNOSIS — E7800 Pure hypercholesterolemia, unspecified: Secondary | ICD-10-CM | POA: Diagnosis not present

## 2020-01-25 LAB — LIPID PANEL
Cholesterol: 164 mg/dL (ref 0–200)
HDL: 68 mg/dL (ref >39.00)
LDL Cholesterol: 85 mg/dL (ref 0–99)
NonHDL: 96.27
Total CHOL/HDL Ratio: 2
Triglycerides: 55 mg/dL (ref 0.0–149.0)
VLDL: 11 mg/dL (ref 0.0–40.0)

## 2020-01-25 LAB — COMPREHENSIVE METABOLIC PANEL
ALT: 15 U/L (ref 0–53)
AST: 18 U/L (ref 0–37)
Albumin: 4.1 g/dL (ref 3.5–5.2)
Alkaline Phosphatase: 47 U/L (ref 39–117)
BUN: 17 mg/dL (ref 6–23)
CO2: 30 mEq/L (ref 19–32)
Calcium: 9.4 mg/dL (ref 8.4–10.5)
Chloride: 106 mEq/L (ref 96–112)
Creatinine, Ser: 0.93 mg/dL (ref 0.40–1.50)
GFR: 80.8 mL/min (ref >60.00)
Glucose, Bld: 142 mg/dL — ABNORMAL HIGH (ref 70–99)
Potassium: 4.7 mEq/L (ref 3.5–5.1)
Sodium: 142 mEq/L (ref 135–145)
Total Bilirubin: 0.8 mg/dL (ref 0.2–1.2)
Total Protein: 6.6 g/dL (ref 6.0–8.3)

## 2020-01-25 LAB — HEMOGLOBIN A1C: Hgb A1c MFr Bld: 7.1 % — ABNORMAL HIGH (ref 4.6–6.5)

## 2020-01-28 ENCOUNTER — Other Ambulatory Visit: Payer: Self-pay

## 2020-01-28 ENCOUNTER — Encounter: Payer: Self-pay | Admitting: Endocrinology

## 2020-01-28 ENCOUNTER — Ambulatory Visit: Payer: Medicare PPO | Admitting: Endocrinology

## 2020-01-28 VITALS — BP 160/102 | HR 76 | Ht 73.0 in | Wt 176.6 lb

## 2020-01-28 DIAGNOSIS — Z23 Encounter for immunization: Secondary | ICD-10-CM | POA: Diagnosis not present

## 2020-01-28 DIAGNOSIS — E7800 Pure hypercholesterolemia, unspecified: Secondary | ICD-10-CM

## 2020-01-28 DIAGNOSIS — R030 Elevated blood-pressure reading, without diagnosis of hypertension: Secondary | ICD-10-CM

## 2020-01-28 DIAGNOSIS — E1165 Type 2 diabetes mellitus with hyperglycemia: Secondary | ICD-10-CM | POA: Diagnosis not present

## 2020-01-28 MED ORDER — TRULICITY 1.5 MG/0.5ML ~~LOC~~ SOAJ
1.5000 mg | SUBCUTANEOUS | 1 refills | Status: DC
Start: 2020-01-28 — End: 2020-07-09

## 2020-01-28 MED ORDER — GLIMEPIRIDE 1 MG PO TABS: ORAL_TABLET | ORAL | 1 refills | Status: DC

## 2020-01-28 MED ORDER — METFORMIN HCL ER 750 MG PO TB24
750.0000 mg | ORAL_TABLET | Freq: Two times a day (BID) | ORAL | 1 refills | Status: DC
Start: 2020-01-28 — End: 2020-07-13

## 2020-01-28 MED ORDER — PRAVASTATIN SODIUM 40 MG PO TABS
40.0000 mg | ORAL_TABLET | Freq: Every day | ORAL | 1 refills | Status: DC
Start: 2020-01-28 — End: 2020-07-13

## 2020-01-28 NOTE — Progress Notes (Signed)
Patient ID: Juan Morales, male   DOB: Apr 05, 1945, 74 y.o.   MRN: [id number]   Reason for Appointment:  follow-up   History of Present Illness   Diagnosis: Type 2 DIABETES MELITUS  He was initially diagnosed with prediabetes in 2003. Previously had relatively mild diabetes which was previously well-controlled with low-dose Jentadueto or Janumet XR His A1c had been usually under 7%   When his A1c had gone up to 7.6% in 7/15 he was started on Trulicity  0.75 mg weekly.   In 8/18 because of relatively high A1c and fasting sugars he was started on Amaryl 1 mg at dinnertime  RECENT HISTORY:  Last visit was in 1/21  Current medication regimen: Trulicity 1.5 mg weekly, Amaryl 1 mg at dinner and Metformin ER 1500 mg daily  Current blood sugar patterns, daily management and problems identified:  His A1c is slightly higher at 7.1 compared to 6.8    He did bring his meter for download today  As before he is again checking blood sugars mostly fasting and these are relatively good  Not clear why his A1c is higher but he thinks that he may be getting off his diet sweets at times  Has only a couple of readings in the afternoon which near normal  He has been regular with his Trulicity every week  He has been able to walk fairly regularly and he thinks he is walking 20 to 25 miles a week Also weight has gone down 10 pounds  Glucose in the lab was 142 fasting  Side effects from medications:  none  Monitors blood glucose: Once a day or less.    Glucometer: Accucheck   guide      Blood Glucose readings by DOWNLOAD:  FASTING range in the last 30 days 102-173, average 122     Wt Readings from Last 3 Encounters:  01/28/20 176 lb 9.6 oz (80.1 kg)  04/23/19 186 lb 3.2 oz (84.5 kg)  01/08/19 185 lb (83.9 kg)   Lab Results  Component Value Date   HGBA1C 7.1 (H) 01/25/2020   HGBA1C 6.8 (H) 04/20/2019   HGBA1C 7.7 (H) 01/05/2019   Lab Results  Component Value Date    MICROALBUR 1.5 01/05/2019   LDLCALC 85 01/25/2020   CREATININE 0.93 01/25/2020    OTHER active problems: See review of systems  Lab on 01/25/2020  Component Date Value Ref Range Status   Cholesterol 01/25/2020 164  0 - 200 mg/dL Final   ATP III Classification       Desirable:  < 200 mg/dL               Borderline High:  200 - 239 mg/dL          High:  > = 277 mg/dL   Triglycerides 41/28/7867 55.0  0 - 149 mg/dL Final   Normal:  <672 mg/dLBorderline High:  150 - 199 mg/dL   HDL 09/47/0962 83.66  >39.00 mg/dL Final   VLDL 29/47/6546 11.0  0.0 - 40.0 mg/dL Final   LDL Cholesterol 01/25/2020 85  0 - 99 mg/dL Final   Total CHOL/HDL Ratio 01/25/2020 2   Final                  Men          Women1/2 Average Risk     3.4          3.3Average Risk          5.0  4.42X Average Risk          9.6          7.13X Average Risk          15.0          11.0                       NonHDL 01/25/2020 96.27   Final   NOTE:  Non-HDL goal should be 30 mg/dL higher than patient's LDL goal (i.e. LDL goal of < 70 mg/dL, would have non-HDL goal of < 100 mg/dL)   Sodium 36/64/4034 742  135 - 145 mEq/L Final   Potassium 01/25/2020 4.7  3.5 - 5.1 mEq/L Final   Chloride 01/25/2020 106  96 - 112 mEq/L Final   CO2 01/25/2020 30  19 - 32 mEq/L Final   Glucose, Bld 01/25/2020 142* 70 - 99 mg/dL Final   BUN 59/56/3875 17  6 - 23 mg/dL Final   Creatinine, Ser 01/25/2020 0.93  0.40 - 1.50 mg/dL Final   Total Bilirubin 01/25/2020 0.8  0.2 - 1.2 mg/dL Final   Alkaline Phosphatase 01/25/2020 47  39 - 117 U/L Final   AST 01/25/2020 18  0 - 37 U/L Final   ALT 01/25/2020 15  0 - 53 U/L Final   Total Protein 01/25/2020 6.6  6.0 - 8.3 g/dL Final   Albumin 64/33/2951 4.1  3.5 - 5.2 g/dL Final   GFR 88/41/6606 80.80  >60.00 mL/min Final   Calculated using the CKD-EPI Creatinine Equation (2021)   Calcium 01/25/2020 9.4  8.4 - 10.5 mg/dL Final   Hgb T0Z MFr Bld 01/25/2020 7.1* 4.6 - 6.5 % Final    Glycemic Control Guidelines for People with Diabetes:Non Diabetic:  <6%Goal of Therapy: <7%Additional Action Suggested:  >8%     Allergies as of 01/28/2020   No Known Allergies     Medication List       Accurate as of January 28, 2020 12:55 PM. If you have any questions, ask your nurse or doctor.        calcium carbonate 1250 (500 Ca) MG tablet Commonly known as: OS-CAL - dosed in mg of elemental calcium Take 1 tablet by mouth daily.   Chromium 1000 MCG Tabs Take 1 tablet by mouth daily.   glimepiride 1 MG tablet Commonly known as: AMARYL Take 1 tablet by mouth daily before supper.   glucose blood test strip Commonly known as: Accu-Chek Guide Use accu chek guide test strips as instructed to check blood sugar three times daily.   metFORMIN 750 MG 24 hr tablet Commonly known as: GLUCOPHAGE-XR Take 1 tablet (750 mg total) by mouth 2 (two) times daily.   multivitamin with minerals tablet Take 1 tablet by mouth daily.   pravastatin 40 MG tablet Commonly known as: PRAVACHOL Take 1 tablet (40 mg total) by mouth daily.   sildenafil 100 MG tablet Commonly known as: Viagra Take 1 tablet (100 mg total) by mouth daily as needed for erectile dysfunction.   Trulicity 1.5 MG/0.5ML Sopn Generic drug: Dulaglutide Inject 1.5 mg into the skin once a week. Inject 1.5mg  under the skin once weekly.   zinc gluconate 50 MG tablet Take 50 mg by mouth daily.       Allergies: No Known Allergies  Past Medical History:  Diagnosis Date   Arthritis    Diabetes mellitus without complication (HCC)    Erectile dysfunction    Hyperlipidemia    Macular  degeneration     Past Surgical History:  Procedure Laterality Date   arthroscopic knee Left    times 2   EYE SURGERY Bilateral    cataract removal   PARTIAL KNEE ARTHROPLASTY Left 07/26/2015   Procedure: LEFT UNICOMPARTMENTAL KNEE ARTHROPLASTY;  Surgeon: Kathryne Hitchhristopher Y Blackman, MD;  [facility];  Service: Orthopedics;   Laterality: Left;    Family History  Problem Relation Age of Onset   Diabetes Neg Hx    Heart disease Neg Hx    Stroke Neg Hx    Hypertension Father     Social History:  reports that he has never smoked. He has never used smokeless tobacco. He reports that he does not drink alcohol and does not use drugs.  Review of Systems:   Usually does not have a high blood pressure but it is persistently high in the office checked 3 times Has not monitored at home  BP Readings from Last 3 Encounters:  01/28/20 (!) 160/102  04/23/19 130/80  01/08/19 140/78    Has macular degeneration, has had regular eye exams  No history of hypertension  HYPERLIPIDEMIA: The lipid abnormality consists of elevated LDL .  Has been treated with pravastatin, 40 mg which he is taking regularly  LDL is about the same and below 100   Lab Results  Component Value Date   CHOL 164 01/25/2020   CHOL 156 04/20/2019   CHOL 197 01/05/2019   Lab Results  Component Value Date   HDL 68.00 01/25/2020   HDL 57.00 04/20/2019   HDL 61.50 01/05/2019   Lab Results  Component Value Date   LDLCALC 85 01/25/2020   LDLCALC 87 04/20/2019   LDLCALC 118 (H) 01/05/2019   Lab Results  Component Value Date   TRIG 55.0 01/25/2020   TRIG 57.0 04/20/2019   TRIG 87.0 01/05/2019   Lab Results  Component Value Date   CHOLHDL 2 01/25/2020   CHOLHDL 3 04/20/2019   CHOLHDL 3 01/05/2019   Lab Results  Component Value Date   LDLDIRECT 144.3 02/16/2014   LDLDIRECT 151.2 02/23/2013   LDLDIRECT 138.1 10/22/2012     He has had long-standing erectile dysfunction,  has been prescribed Viagra with good results He gets his [place] from [place], new prescription given today      Examination:   BP (!) 160/102 (BP Location: Right Arm)    Pulse 76    Ht 6\' 1"  (1.854 m)    Wt 176 lb 9.6 oz (80.1 kg)    SpO2 96%    BMI 23.30 kg/m   Body mass index is 23.3 kg/m.      ASSESSMENT/ PLAN:   Diabetes type 2 with  BMI 25 See history of present illness for detailed discussion of current diabetes management, blood sugar patterns and problems identified  A1c is 7.1, was 6.8  Although he has done well with Trulicity in the past is getting a slight increase in his A1c likely will be from inconsistent diet Has lost weight Walking regularly  He was again reminded to start checking readings alternating fasting and 2-3 hours after meals  Hyperlipidemia: On pravastatin with good control  Probable hypertension: Not clear if this is related to new onset hypertension or whitecoat syndrome He has a blood pressure monitor at home and he will start checking twice a week To call if blood pressures consistently high but review in 6 weeks  There are no Patient Instructions on file for this visit.  Influenza vaccine given  Reather Littler 01/28/2020, 12:55 PM

## 2020-01-29 LAB — HM DIABETES EYE EXAM

## 2020-02-03 ENCOUNTER — Other Ambulatory Visit: Payer: Self-pay | Admitting: Endocrinology

## 2020-02-04 ENCOUNTER — Encounter: Payer: Self-pay | Admitting: *Deleted

## 2020-03-08 ENCOUNTER — Ambulatory Visit (INDEPENDENT_AMBULATORY_CARE_PROVIDER_SITE_OTHER): Payer: Medicare PPO | Admitting: Endocrinology

## 2020-03-08 ENCOUNTER — Encounter: Payer: Self-pay | Admitting: Endocrinology

## 2020-03-08 ENCOUNTER — Other Ambulatory Visit: Payer: Self-pay

## 2020-03-08 VITALS — BP 138/86 | HR 84 | Ht 73.0 in | Wt 181.4 lb

## 2020-03-08 DIAGNOSIS — I10 Essential (primary) hypertension: Secondary | ICD-10-CM

## 2020-03-08 DIAGNOSIS — E119 Type 2 diabetes mellitus without complications: Secondary | ICD-10-CM | POA: Diagnosis not present

## 2020-03-08 MED ORDER — VALSARTAN 40 MG PO TABS: 40.0000 mg | ORAL_TABLET | Freq: Every day | ORAL | 2 refills | Status: DC

## 2020-03-08 NOTE — Progress Notes (Signed)
Patient ID: Juan Morales, male   DOB: Dec 02, 1945, 74 y.o.   MRN: [id number]   Reason for Appointment:  follow-up of blood pressure  History of Present Illness   HYPERTENSION:  His blood pressure was unusually high in 11/21 However he was reluctant to start medication at that time and has been monitoring his blood pressure at home He has an unknown brand of blood pressure monitor and it is probably a few years old Since his blood pressure was high he says he is trying to cut back on high salt foods  Blood pressure at home has ranged between about 125/78 up to 145/88  Blood pressure was checked 3 times in the office today  BP Readings from Last 3 Encounters:  03/08/20 138/86  01/28/20 (!) 160/102  04/23/19 130/80     The following is a copy of the previous note:   Type 2 DIABETES MELITUS  He was initially diagnosed with prediabetes in 2003. Previously had relatively mild diabetes which was previously well-controlled with low-dose Jentadueto or Janumet XR His A1c had been usually under 7%   When his A1c had gone up to 7.6% in 7/15 he was started on Trulicity  0.75 mg weekly.   In 8/18 because of relatively high A1c and fasting sugars he was started on Amaryl 1 mg at dinnertime  RECENT HISTORY:  Last visit was in 1/21  Current medication regimen: Trulicity 1.5 mg weekly, Amaryl 1 mg at dinner and Metformin ER 1500 mg daily  Current blood sugar patterns, daily management and problems identified:  His A1c is slightly higher at 7.1 compared to 6.8    He did bring his meter for download today  As before he is again checking blood sugars mostly fasting and these are relatively good  Not clear why his A1c is higher but he thinks that he may be getting off his diet sweets at times  Has only a couple of readings in the afternoon which near normal  He has been regular with his Trulicity every week  He has been able to walk fairly regularly and he thinks he is  walking 20 to 25 miles a week Also weight has gone down 10 pounds  Glucose in the lab was 142 fasting  Side effects from medications:  none  Monitors blood glucose: Once a day or less.    Glucometer: Accucheck   guide      Blood Glucose readings by DOWNLOAD:  FASTING range in the last 30 days 102-173, average 122     Wt Readings from Last 3 Encounters:  03/08/20 181 lb 6.4 oz (82.3 kg)  01/28/20 176 lb 9.6 oz (80.1 kg)  04/23/19 186 lb 3.2 oz (84.5 kg)   Lab Results  Component Value Date   HGBA1C 7.1 (H) 01/25/2020   HGBA1C 6.8 (H) 04/20/2019   HGBA1C 7.7 (H) 01/05/2019   Lab Results  Component Value Date   MICROALBUR 1.5 01/05/2019   LDLCALC 85 01/25/2020   CREATININE 0.93 01/25/2020    OTHER active problems: See review of systems  No visits with results within 1 Week(s) from this visit.  Latest known visit with results is:  Abstract on 02/04/2020  Component Date Value Ref Range Status  . HM Diabetic Eye Exam 12/18/2019 No Retinopathy  No Retinopathy Final    Allergies as of 03/08/2020   No Known Allergies     Medication List       Accurate as of March 08, 2020  9:26 PM. If you have any questions, ask your nurse or doctor.        calcium carbonate 1250 (500 Ca) MG tablet Commonly known as: OS-CAL - dosed in mg of elemental calcium Take 1 tablet by mouth daily.   Chromium 1000 MCG Tabs Take 1 tablet by mouth daily.   glimepiride 1 MG tablet Commonly known as: AMARYL TAKE 1 TABLET BY MOUTH DAILY BEFORE SUPPER.   glucose blood test strip Commonly known as: Accu-Chek Guide Use accu chek guide test strips as instructed to check blood sugar three times daily.   metFORMIN 750 MG 24 hr tablet Commonly known as: GLUCOPHAGE-XR Take 1 tablet (750 mg total) by mouth 2 (two) times daily.   multivitamin with minerals tablet Take 1 tablet by mouth daily.   pravastatin 40 MG tablet Commonly known as: PRAVACHOL Take 1 tablet (40 mg total) by mouth  daily.   sildenafil 100 MG tablet Commonly known as: Viagra Take 1 tablet (100 mg total) by mouth daily as needed for erectile dysfunction.   Trulicity 1.5 MG/0.5ML Sopn Generic drug: Dulaglutide Inject 1.5 mg into the skin once a week. Inject 1.5mg  under the skin once weekly.   valsartan 40 MG tablet Commonly known as: Diovan Take 1 tablet (40 mg total) by mouth daily. Started by: Reather Littler, MD   zinc gluconate 50 MG tablet Take 50 mg by mouth daily.       Allergies: No Known Allergies  Past Medical History:  Diagnosis Date  . Arthritis   . Diabetes mellitus without complication (HCC)   . Erectile dysfunction   . Hyperlipidemia   . Macular degeneration     Past Surgical History:  Procedure Laterality Date  . arthroscopic knee Left    times 2  . EYE SURGERY Bilateral    cataract removal  . PARTIAL KNEE ARTHROPLASTY Left 07/26/2015   Procedure: LEFT UNICOMPARTMENTAL KNEE ARTHROPLASTY;  Surgeon: Kathryne Hitch, MD;  Location[facility];  Service: Orthopedics;  Laterality: Left;    Family History  Problem Relation Age of Onset  . Diabetes Neg Hx   . Heart disease Neg Hx   . Stroke Neg Hx   . Hypertension Father     Social History:  reports that he has never smoked. He has never used smokeless tobacco. He reports that he does not drink alcohol and does not use drugs.  Review of Systems:  Following is a copy of the previous note  Has macular degeneration, has had regular eye exams   HYPERLIPIDEMIA: The lipid abnormality consists of elevated LDL .  Has been treated with pravastatin, 40 mg which he is taking regularly  LDL is about the same and below 100   Lab Results  Component Value Date   CHOL 164 01/25/2020   CHOL 156 04/20/2019   CHOL 197 01/05/2019   Lab Results  Component Value Date   HDL 68.00 01/25/2020   HDL 57.00 04/20/2019   HDL 61.50 01/05/2019   Lab Results  Component Value Date   LDLCALC 85 01/25/2020   LDLCALC 87 04/20/2019    LDLCALC 118 (H) 01/05/2019   Lab Results  Component Value Date   TRIG 55.0 01/25/2020   TRIG 57.0 04/20/2019   TRIG 87.0 01/05/2019   Lab Results  Component Value Date   CHOLHDL 2 01/25/2020   CHOLHDL 3 04/20/2019   CHOLHDL 3 01/05/2019   Lab Results  Component Value Date   LDLDIRECT 144.3 02/16/2014   LDLDIRECT 151.2 02/23/2013  LDLDIRECT 138.1 10/22/2012     He has had long-standing erectile dysfunction,  has been prescribed Viagra with good results He gets his prescription from [place], new prescription given today      Examination:   BP 138/86   Pulse 84   Ht 6\' 1"  (1.854 m)   Wt 181 lb 6.4 oz (82.3 kg)   SpO2 98%   BMI 23.93 kg/m   Body mass index is 23.93 kg/m.      ASSESSMENT/ PLAN:   Mild hypertension: Blood pressure is consistently high in the office Also his blood pressure usually over 80 diastolic at home and frequently over 130 systolic  He likely has early hypertension and would benefit from starting low-dose ARB drug which would also have benefits on long-term renal protection with his diabetes diagnosis He will start on 40 mg valsartan and he will follow up in about 2 to 3 months To call if blood pressure continues to increase Also recommended avoiding high sodium meals such as fast food  To continue checking blood pressure at home and bring monitor for comparison on the next visit  There are no Patient Instructions on file for this visit.      03/08/2020, 9:26 PM

## 2020-06-05 ENCOUNTER — Other Ambulatory Visit: Payer: Self-pay

## 2020-06-07 ENCOUNTER — Other Ambulatory Visit: Payer: Self-pay | Admitting: *Deleted

## 2020-06-07 DIAGNOSIS — I10 Essential (primary) hypertension: Secondary | ICD-10-CM

## 2020-06-07 MED ORDER — VALSARTAN 40 MG PO TABS: 40.0000 mg | ORAL_TABLET | Freq: Every day | ORAL | 2 refills | Status: DC

## 2020-06-13 ENCOUNTER — Other Ambulatory Visit: Payer: Medicare PPO

## 2020-06-16 ENCOUNTER — Ambulatory Visit: Payer: Medicare PPO | Admitting: Endocrinology

## 2020-07-09 ENCOUNTER — Other Ambulatory Visit: Payer: Self-pay | Admitting: *Deleted

## 2020-07-09 MED ORDER — TRULICITY 1.5 MG/0.5ML ~~LOC~~ SOAJ: 1.5000 mg | SUBCUTANEOUS | 1 refills | Status: DC

## 2020-07-12 ENCOUNTER — Other Ambulatory Visit: Payer: Medicare PPO

## 2020-07-13 ENCOUNTER — Other Ambulatory Visit: Payer: Self-pay | Admitting: Endocrinology

## 2020-07-14 ENCOUNTER — Ambulatory Visit: Payer: Medicare PPO | Admitting: Endocrinology

## 2020-08-05 DIAGNOSIS — N529 Male erectile dysfunction, unspecified: Secondary | ICD-10-CM | POA: Diagnosis not present

## 2020-08-05 DIAGNOSIS — Z1211 Encounter for screening for malignant neoplasm of colon: Secondary | ICD-10-CM | POA: Diagnosis not present

## 2020-08-05 DIAGNOSIS — Z7984 Long term (current) use of oral hypoglycemic drugs: Secondary | ICD-10-CM | POA: Diagnosis not present

## 2020-08-05 DIAGNOSIS — E1169 Type 2 diabetes mellitus with other specified complication: Secondary | ICD-10-CM | POA: Diagnosis not present

## 2020-08-05 DIAGNOSIS — E7800 Pure hypercholesterolemia, unspecified: Secondary | ICD-10-CM | POA: Diagnosis not present

## 2020-08-05 DIAGNOSIS — Z79899 Other long term (current) drug therapy: Secondary | ICD-10-CM | POA: Diagnosis not present

## 2020-08-05 DIAGNOSIS — Z0000 Encounter for general adult medical examination without abnormal findings: Secondary | ICD-10-CM | POA: Diagnosis not present

## 2020-08-05 DIAGNOSIS — Z23 Encounter for immunization: Secondary | ICD-10-CM | POA: Diagnosis not present

## 2020-08-22 ENCOUNTER — Other Ambulatory Visit: Payer: Self-pay | Admitting: Endocrinology

## 2020-08-22 DIAGNOSIS — Z1159 Encounter for screening for other viral diseases: Secondary | ICD-10-CM

## 2020-08-22 DIAGNOSIS — E119 Type 2 diabetes mellitus without complications: Secondary | ICD-10-CM

## 2020-08-22 DIAGNOSIS — E7800 Pure hypercholesterolemia, unspecified: Secondary | ICD-10-CM

## 2020-08-22 DIAGNOSIS — M1712 Unilateral primary osteoarthritis, left knee: Secondary | ICD-10-CM

## 2020-08-22 NOTE — Telephone Encounter (Signed)
Please advise 

## 2020-08-23 ENCOUNTER — Other Ambulatory Visit (INDEPENDENT_AMBULATORY_CARE_PROVIDER_SITE_OTHER): Payer: Medicare PPO

## 2020-08-23 ENCOUNTER — Other Ambulatory Visit: Payer: Self-pay

## 2020-08-23 DIAGNOSIS — Z1159 Encounter for screening for other viral diseases: Secondary | ICD-10-CM | POA: Diagnosis not present

## 2020-08-23 DIAGNOSIS — E7800 Pure hypercholesterolemia, unspecified: Secondary | ICD-10-CM

## 2020-08-23 DIAGNOSIS — M1712 Unilateral primary osteoarthritis, left knee: Secondary | ICD-10-CM | POA: Diagnosis not present

## 2020-08-23 DIAGNOSIS — E119 Type 2 diabetes mellitus without complications: Secondary | ICD-10-CM

## 2020-08-23 LAB — COMPREHENSIVE METABOLIC PANEL
ALT: 14 U/L (ref 0–53)
AST: 14 U/L (ref 0–37)
Albumin: 4.2 g/dL (ref 3.5–5.2)
Alkaline Phosphatase: 41 U/L (ref 39–117)
BUN: 22 mg/dL (ref 6–23)
CO2: 28 mEq/L (ref 19–32)
Calcium: 10.2 mg/dL (ref 8.4–10.5)
Chloride: 104 mEq/L (ref 96–112)
Creatinine, Ser: 1.14 mg/dL (ref 0.40–1.50)
GFR: 63.03 mL/min (ref >60.00)
Glucose, Bld: 145 mg/dL — ABNORMAL HIGH (ref 70–99)
Potassium: 4.6 mEq/L (ref 3.5–5.1)
Sodium: 142 mEq/L (ref 135–145)
Total Bilirubin: 0.7 mg/dL (ref 0.2–1.2)
Total Protein: 6.6 g/dL (ref 6.0–8.3)

## 2020-08-23 LAB — TSH: TSH: 1.42 u[IU]/mL (ref 0.35–4.50)

## 2020-08-23 LAB — MICROALBUMIN / CREATININE URINE RATIO
Creatinine,U: 190.7 mg/dL
Microalb Creat Ratio: 1.3 mg/g (ref 0.0–30.0)
Microalb, Ur: 2.5 mg/dL — ABNORMAL HIGH (ref 0.0–1.9)

## 2020-08-23 LAB — CBC
HCT: 41 % (ref 39.0–52.0)
Hemoglobin: 13.9 g/dL (ref 13.0–17.0)
MCHC: 33.8 g/dL (ref 30.0–36.0)
MCV: 89.5 fl (ref 78.0–100.0)
Platelets: 202 10*3/uL (ref 150.0–400.0)
RBC: 4.58 Mil/uL (ref 4.22–5.81)
RDW: 13.4 % (ref 11.5–15.5)
WBC: 7.3 10*3/uL (ref 4.0–10.5)

## 2020-08-23 LAB — HEMOGLOBIN A1C: Hgb A1c MFr Bld: 8 % — ABNORMAL HIGH (ref 4.6–6.5)

## 2020-08-24 LAB — HEPATITIS C ANTIBODY: Hep C Virus Ab: <0.1 s/co ratio (ref 0.0–0.9)

## 2020-08-25 ENCOUNTER — Ambulatory Visit: Payer: Medicare PPO | Admitting: Endocrinology

## 2020-08-25 ENCOUNTER — Encounter: Payer: Self-pay | Admitting: Endocrinology

## 2020-08-25 ENCOUNTER — Other Ambulatory Visit: Payer: Self-pay

## 2020-08-25 VITALS — BP 118/76 | HR 80 | Ht 73.0 in | Wt 180.4 lb

## 2020-08-25 DIAGNOSIS — E1165 Type 2 diabetes mellitus with hyperglycemia: Secondary | ICD-10-CM | POA: Diagnosis not present

## 2020-08-25 DIAGNOSIS — E7800 Pure hypercholesterolemia, unspecified: Secondary | ICD-10-CM

## 2020-08-25 DIAGNOSIS — R030 Elevated blood-pressure reading, without diagnosis of hypertension: Secondary | ICD-10-CM | POA: Diagnosis not present

## 2020-08-25 NOTE — Progress Notes (Signed)
Patient ID: Juan Morales, male   DOB: 1945/04/09, 75 y.o.   MRN: [id number]   Reason for Appointment:  follow-up   History of Present Illness   HYPERTENSION:  His blood pressure was unusually high in 11/21 He has an unknown brand of blood pressure monitor and it is probably a few years old  Prior to starting valsartan blood pressure at home ranged between about 125/78 up to 145/88  He has been on valsartan since 12/21 with better control With more recently home blood pressures are ranging between 94/61 and 139/97  However in the last few days occasionally he will feel lightheaded and his systolic blood pressure will be in the 90s systolic; this would be generally when he is more active and in the heat  BP Readings from Last 3 Encounters:  08/25/20 118/76  03/08/20 138/86  01/28/20 (!) 160/102      Type 2 DIABETES MELITUS  He was initially diagnosed with prediabetes in 2003. Previously had relatively mild diabetes which was previously well-controlled with low-dose Jentadueto or Janumet XR His A1c had been usually under 7%   When his A1c had gone up to 7.6% in 7/15 he was started on Trulicity  0.75 mg weekly.   In 8/18 because of relatively high A1c and fasting sugars he was started on Amaryl 1 mg at dinnertime  RECENT HISTORY:   Current medication regimen: Trulicity 1.5 mg weekly, Amaryl 1 mg at dinner and Metformin ER 1500 mg daily  Current blood sugar patterns, daily management and problems identified:  His A1c is significantly higher at 8% compared to 7.1    He did bring his meter for download today  His blood sugars overall are higher although he checking readings primarily in the morning  He thinks that this is from poor diet and eating sweets and candy bars  Weight is about the same  Highest blood sugar was 302 in the morning  As before he is again checking blood sugars mostly fasting and these are relatively good  Not clear why his A1c is  higher but he thinks that he may be getting off his diet sweets at times  Has only a couple of readings in the afternoon which near normal  He has been regular with his Trulicity every week although one of his vacations this year he did not take it for about a week  He has been able to walk about 3 days a week and playing some golf also  Glucose in the lab was 145 fasting  Side effects from medications:  none  Monitors blood glucose: Once a day or less.    Glucometer: Accucheck   guide      Blood Glucose readings by DOWNLOAD:  PRE-MEAL Fasting Lunch Dinner Bedtime Overall  Glucose range:  111-302  159  179    Mean/median:  144    154   POST-MEAL PC Breakfast PC Lunch PC Dinner  Glucose range:   ?  Mean/median:       FASTING range in the last 30 days 102-173, average 122   Wt Readings from Last 3 Encounters:  08/25/20 180 lb 6.4 oz (81.8 kg)  03/08/20 181 lb 6.4 oz (82.3 kg)  01/28/20 176 lb 9.6 oz (80.1 kg)   Lab Results  Component Value Date   HGBA1C 8.0 (H) 08/23/2020   HGBA1C 7.1 (H) 01/25/2020   HGBA1C 6.8 (H) 04/20/2019   Lab Results  Component Value Date   MICROALBUR 2.5 (  H) 08/23/2020   LDLCALC 85 01/25/2020   CREATININE 1.14 08/23/2020    OTHER active problems: See review of systems  Lab on 08/23/2020  Component Date Value Ref Range Status  . Hep C Virus Ab 08/23/2020 <0.1  0.0 - 0.9 s/co ratio Final   Comment:                                   Negative:     < 0.8                              Indeterminate: 0.8 - 0.9                                   Positive:     > 0.9  HCV antibody alone does not differentiate between  previous resolved infection and active infection.  The CDC and current clinical guidelines recommend  that a positive HCV antibody result be followed up  with an HCV RNA test to support the diagnosis of  acute HCV infection. [facility] offers Hepatitis C  Virus (HCV) RNA, Diagnosis, NAA (440347) and  Hepatitis C Virus (HCV) Antibody  with reflex to  Quantitative Real-time PCR (144050).   . TSH 08/23/2020 1.42  0.35 - 4.50 uIU/mL Final  . WBC 08/23/2020 7.3  4.0 - 10.5 K/uL Final  . RBC 08/23/2020 4.58  4.22 - 5.81 Mil/uL Final  . Platelets 08/23/2020 202.0  150.0 - 400.0 K/uL Final  . Hemoglobin 08/23/2020 13.9  13.0 - 17.0 g/dL Final  . HCT 42/59/5638 41.0  39.0 - 52.0 % Final  . MCV 08/23/2020 89.5  78.0 - 100.0 fl Final  . MCHC 08/23/2020 33.8  30.0 - 36.0 g/dL Final  . RDW 75/64/3329 13.4  11.5 - 15.5 % Final  . Microalb, Ur 08/23/2020 2.5* 0.0 - 1.9 mg/dL Final  . Creatinine,U 51/88/4166 190.7  mg/dL Final  . Microalb Creat Ratio 08/23/2020 1.3  0.0 - 30.0 mg/g Final  . Sodium 08/23/2020 142  135 - 145 mEq/L Final  . Potassium 08/23/2020 4.6  3.5 - 5.1 mEq/L Final  . Chloride 08/23/2020 104  96 - 112 mEq/L Final  . CO2 08/23/2020 28  19 - 32 mEq/L Final  . Glucose, Bld 08/23/2020 145* 70 - 99 mg/dL Final  . BUN 09/23/1599 22  6 - 23 mg/dL Final  . Creatinine, Ser 08/23/2020 1.14  0.40 - 1.50 mg/dL Final  . Total Bilirubin 08/23/2020 0.7  0.2 - 1.2 mg/dL Final  . Alkaline Phosphatase 08/23/2020 41  39 - 117 U/L Final  . AST 08/23/2020 14  0 - 37 U/L Final  . ALT 08/23/2020 14  0 - 53 U/L Final  . Total Protein 08/23/2020 6.6  6.0 - 8.3 g/dL Final  . Albumin 09/32/3557 4.2  3.5 - 5.2 g/dL Final  . GFR 32/20/2542 63.03  >60.00 mL/min Final   Calculated using the CKD-EPI Creatinine Equation (2021)  . Calcium 08/23/2020 10.2  8.4 - 10.5 mg/dL Final  . Hgb H0W MFr Bld 08/23/2020 8.0* 4.6 - 6.5 % Final   Glycemic Control Guidelines for People with Diabetes:Non Diabetic:  <6%Goal of Therapy: <7%Additional Action Suggested:  >8%     Allergies as of 08/25/2020   No Known Allergies     Medication List  Accurate as of August 25, 2020  4:25 PM. If you have any questions, ask your nurse or doctor.        calcium carbonate 1250 (500 Ca) MG tablet Commonly known as: OS-CAL - dosed in mg of elemental  calcium Take 1 tablet by mouth daily.   Chromium 1000 MCG Tabs Take 1 tablet by mouth daily.   glimepiride 1 MG tablet Commonly known as: AMARYL TAKE 1 TABLET BY MOUTH DAILY BEFORE SUPPER.   glucose blood test strip Commonly known as: Accu-Chek Guide Use accu chek guide test strips as instructed to check blood sugar three times daily.   metFORMIN 750 MG 24 hr tablet Commonly known as: GLUCOPHAGE-XR TAKE 1 TABLET TWICE DAILY   multivitamin with minerals tablet Take 1 tablet by mouth daily.   pravastatin 40 MG tablet Commonly known as: PRAVACHOL TAKE 1 TABLET EVERY DAY   sildenafil 100 MG tablet Commonly known as: Viagra Take 1 tablet (100 mg total) by mouth daily as needed for erectile dysfunction.   Trulicity 1.5 MG/0.5ML Sopn Generic drug: Dulaglutide Inject 1.5 mg into the skin once a week. Inject 1.5mg  under the skin once weekly.   valsartan 40 MG tablet Commonly known as: Diovan Take 1 tablet (40 mg total) by mouth daily.   zinc gluconate 50 MG tablet Take 50 mg by mouth daily.       Allergies: No Known Allergies  Past Medical History:  Diagnosis Date  . Arthritis   . Diabetes mellitus without complication (HCC)   . Erectile dysfunction   . Hyperlipidemia   . Macular degeneration     Past Surgical History:  Procedure Laterality Date  . arthroscopic knee Left    times 2  . EYE SURGERY Bilateral    cataract removal  . PARTIAL KNEE ARTHROPLASTY Left 07/26/2015   Procedure: LEFT UNICOMPARTMENTAL KNEE ARTHROPLASTY;  Surgeon: Kathryne Hitch, MD;  Location: [facility] OR;  Service: Orthopedics;  Laterality: Left;    Family History  Problem Relation Age of Onset  . Diabetes Neg Hx   . Heart disease Neg Hx   . Stroke Neg Hx   . Hypertension Father     Social History:  reports that he has never smoked. He has never used smokeless tobacco. He reports that he does not drink alcohol and does not use drugs.  Review of Systems:  His PCP wanted his  hepatitis C antibody checked and routine test for CBC and TSH which were normal  Has macular degeneration, has had regular eye exams   HYPERLIPIDEMIA: The lipid abnormality consists of elevated LDL .  Has been treated with pravastatin, 40 mg which he is taking regularly  LDL is about the same and below 100   Lab Results  Component Value Date   CHOL 164 01/25/2020   CHOL 156 04/20/2019   CHOL 197 01/05/2019   Lab Results  Component Value Date   HDL 68.00 01/25/2020   HDL 57.00 04/20/2019   HDL 61.50 01/05/2019   Lab Results  Component Value Date   LDLCALC 85 01/25/2020   LDLCALC 87 04/20/2019   LDLCALC 118 (H) 01/05/2019   Lab Results  Component Value Date   TRIG 55.0 01/25/2020   TRIG 57.0 04/20/2019   TRIG 87.0 01/05/2019   Lab Results  Component Value Date   CHOLHDL 2 01/25/2020   CHOLHDL 3 04/20/2019   CHOLHDL 3 01/05/2019   Lab Results  Component Value Date   LDLDIRECT 144.3 02/16/2014   LDLDIRECT 151.2  02/23/2013   LDLDIRECT 138.1 10/22/2012     He has had long-standing erectile dysfunction,  has been prescribed Viagra with good results He gets his prescription from [place]     Examination:   BP 118/76 (BP Location: Left Arm, Patient Position: Sitting, Cuff Size: Normal)   Pulse 80   Ht 6\' 1"  (1.854 m)   Wt 180 lb 6.4 oz (81.8 kg)   SpO2 99%   BMI 23.80 kg/m   Body mass index is 23.8 kg/m.      ASSESSMENT/ PLAN:   Mild hypertension: Blood pressure previously was consistently high in the office However now with taking only 40 mg valsartan he has occasional episodes of lightheadedness and relatively low blood pressure on his meter mostly related to increased activity and heat exposure  For now he will stop his valsartan and monitor at home Let us know if he has unusually high blood pressure readings Will need to compare his home blood pressure monitor with the office meter  Also can do better with overall improving diet  Microalbumin  normal  DIABETES: A1c is higher than usual and he thinks this is mostly from poor diet Also may not be exercising as much Discussed that he will likely benefit from higher dose of Trulicity but he does not want to do this until he tries to improve his diet over the next 3 months He will also try to exercise walking more frequently To continue checking blood sugars regularly at home but also alternating with readings after meals or in the morning Recheck A1c in 3 months  There are no Patient Instructions on file for this visit.      Reather LittlerAjay Karna Abed 08/25/2020, 4:25 PM

## 2020-08-25 NOTE — Patient Instructions (Signed)
Check blood sugars on waking up 3 days a week  Also check blood sugars about 2 hours after meals and do this after different meals by rotation  Recommended blood sugar levels on waking up are 90-130 and about 2 hours after meal is 130-160  Please bring your blood sugar monitor to each visit, thank you   

## 2020-09-07 ENCOUNTER — Other Ambulatory Visit: Payer: Self-pay | Admitting: Endocrinology

## 2020-09-07 DIAGNOSIS — I10 Essential (primary) hypertension: Secondary | ICD-10-CM

## 2020-09-25 ENCOUNTER — Other Ambulatory Visit: Payer: Self-pay | Admitting: Endocrinology

## 2020-09-25 DIAGNOSIS — I10 Essential (primary) hypertension: Secondary | ICD-10-CM

## 2020-10-20 ENCOUNTER — Other Ambulatory Visit: Payer: Self-pay | Admitting: Endocrinology

## 2020-10-20 DIAGNOSIS — I10 Essential (primary) hypertension: Secondary | ICD-10-CM

## 2020-11-05 ENCOUNTER — Other Ambulatory Visit: Payer: Self-pay | Admitting: Endocrinology

## 2020-11-05 DIAGNOSIS — I10 Essential (primary) hypertension: Secondary | ICD-10-CM

## 2020-11-07 NOTE — Telephone Encounter (Signed)
Please advise 

## 2020-11-29 ENCOUNTER — Other Ambulatory Visit (INDEPENDENT_AMBULATORY_CARE_PROVIDER_SITE_OTHER): Payer: Medicare PPO

## 2020-11-29 ENCOUNTER — Other Ambulatory Visit: Payer: Self-pay

## 2020-11-29 DIAGNOSIS — E1165 Type 2 diabetes mellitus with hyperglycemia: Secondary | ICD-10-CM

## 2020-11-29 DIAGNOSIS — E7800 Pure hypercholesterolemia, unspecified: Secondary | ICD-10-CM | POA: Diagnosis not present

## 2020-11-29 LAB — BASIC METABOLIC PANEL
BUN: 21 mg/dL (ref 6–23)
CO2: 30 mEq/L (ref 19–32)
Calcium: 9.6 mg/dL (ref 8.4–10.5)
Chloride: 105 mEq/L (ref 96–112)
Creatinine, Ser: 0.93 mg/dL (ref 0.40–1.50)
GFR: 80.32 mL/min (ref >60.00)
Glucose, Bld: 143 mg/dL — ABNORMAL HIGH (ref 70–99)
Potassium: 4.7 mEq/L (ref 3.5–5.1)
Sodium: 142 mEq/L (ref 135–145)

## 2020-11-29 LAB — LIPID PANEL
Cholesterol: 162 mg/dL (ref 0–200)
HDL: 59 mg/dL (ref >39.00)
LDL Cholesterol: 86 mg/dL (ref 0–99)
NonHDL: 102.78
Total CHOL/HDL Ratio: 3
Triglycerides: 84 mg/dL (ref 0.0–149.0)
VLDL: 16.8 mg/dL (ref 0.0–40.0)

## 2020-11-29 LAB — HEMOGLOBIN A1C: Hgb A1c MFr Bld: 6.8 % — ABNORMAL HIGH (ref 4.6–6.5)

## 2020-11-30 NOTE — Progress Notes (Signed)
Patient ID: Juan MarionGary R Giel, male   DOB: 09/24/1945, 75 y.o.   MRN: [id number]   Reason for Appointment:  follow-up for various problems  History of Present Illness      Type 2 DIABETES MELITUS  He was initially diagnosed with prediabetes in 2003. Previously had relatively mild diabetes which was previously well-controlled with low-dose Jentadueto or Janumet XR His A1c had been usually under 7%   When his A1c had gone up to 7.6% in 7/15 he was started on Trulicity  0.75 mg weekly.   In 8/18 because of relatively high A1c and fasting sugars he was started on Amaryl 1 mg at dinnertime  RECENT HISTORY:   Current medication regimen: Trulicity 1.5 mg weekly, Amaryl 1 mg at dinner and Metformin ER 1500 mg daily  Current blood sugar patterns, daily management and problems identified:  His A1c which was significantly higher at 8% on his last visit is back to 6.8    He did make changes in his diet after his last visit with cutting back on sweets/desserts and high carbohydrate meals  Weight is also down 3 pounds did bring his meter for download today His blood sugars overall are higher although he checking readings primarily in the morning Has only 1 high reading recently of 231 after lunch Otherwise his blood sugars are generally in the 120-140 range As before checking more readings in the mornings and any other time He has been regular with his Trulicity every week quite regularly now He has been able to walk about 15 to 20 miles a week   Glucose in the lab was 143 fasting  Side effects from medications:  none  Monitors blood glucose: Once a day or less.    Glucometer: Accucheck   guide      Blood Glucose readings by DOWNLOAD:   PRE-MEAL Fasting Lunch Dinner Bedtime Overall  Glucose range: 108-151 114 96  96-231  Mean/median: 128    132/128   POST-MEAL PC Breakfast PC Lunch PC Dinner  Glucose range:     Mean/median:   134    Previously  PRE-MEAL Fasting Lunch  Dinner Bedtime Overall  Glucose range:  111-302  159  179    Mean/median:  144    154   POST-MEAL PC Breakfast PC Lunch PC Dinner  Glucose range:   ?  Mean/median:       Wt Readings from Last 3 Encounters:  12/01/20 177 lb 6.4 oz (80.5 kg)  08/25/20 180 lb 6.4 oz (81.8 kg)  03/08/20 181 lb 6.4 oz (82.3 kg)   Lab Results  Component Value Date   HGBA1C 6.8 (H) 11/29/2020   HGBA1C 8.0 (H) 08/23/2020   HGBA1C 7.1 (H) 01/25/2020   Lab Results  Component Value Date   MICROALBUR 2.5 (H) 08/23/2020   LDLCALC 86 11/29/2020   CREATININE 0.93 11/29/2020    OTHER active problems: See review of systems  Lab on 11/29/2020  Component Date Value Ref Range Status   Cholesterol 11/29/2020 162  0 - 200 mg/dL Final   ATP III Classification       Desirable:  < 200 mg/dL               Borderline High:  200 - 239 mg/dL          High:  > = 086240 mg/dL   Triglycerides 57/84/696209/08/2020 84.0  0.0 - 149.0 mg/dL Final   Normal:  <952<150 mg/dLBorderline High:  150 - 199 mg/dL  HDL 11/29/2020 59.00  >39.00 mg/dL Final   VLDL 79/89/2119 16.8  0.0 - 40.0 mg/dL Final   LDL Cholesterol 11/29/2020 86  0 - 99 mg/dL Final   Total CHOL/HDL Ratio 11/29/2020 3   Final                  Men          Women1/2 Average Risk     3.4          3.3Average Risk          5.0          4.42X Average Risk          9.6          7.13X Average Risk          15.0          11.0                       NonHDL 11/29/2020 102.78   Final   NOTE:  Non-HDL goal should be 30 mg/dL higher than patient's LDL goal (i.e. LDL goal of < 70 mg/dL, would have non-HDL goal of < 100 mg/dL)   Sodium 41/74/0814 481  135 - 145 mEq/L Final   Potassium 11/29/2020 4.7  3.5 - 5.1 mEq/L Final   Chloride 11/29/2020 105  96 - 112 mEq/L Final   CO2 11/29/2020 30  19 - 32 mEq/L Final   Glucose, Bld 11/29/2020 143 (A) 70 - 99 mg/dL Final   BUN 85/63/1497 21  6 - 23 mg/dL Final   Creatinine, Ser 11/29/2020 0.93  0.40 - 1.50 mg/dL Final   GFR 02/63/7858 80.32  >60.00  mL/min Final   Calculated using the CKD-EPI Creatinine Equation (2021)   Calcium 11/29/2020 9.6  8.4 - 10.5 mg/dL Final   Hgb I5O MFr Bld 11/29/2020 6.8 (A) 4.6 - 6.5 % Final   Glycemic Control Guidelines for People with Diabetes:Non Diabetic:  <6%Goal of Therapy: <7%Additional Action Suggested:  >8%     Allergies as of 12/01/2020   No Known Allergies      Medication List        Accurate as of December 01, 2020 11:59 PM. If you have any questions, ask your nurse or doctor.          STOP taking these medications    Chromium 1000 MCG Tabs Stopped by: Reather Littler, MD       TAKE these medications    calcium carbonate 1250 (500 Ca) MG tablet Commonly known as: OS-CAL - dosed in mg of elemental calcium Take 1 tablet by mouth daily.   glimepiride 1 MG tablet Commonly known as: AMARYL TAKE 1 TABLET BY MOUTH DAILY BEFORE SUPPER.   glucose blood test strip Commonly known as: Accu-Chek Guide Use accu chek guide test strips as instructed to check blood sugar three times daily.   metFORMIN 750 MG 24 hr tablet Commonly known as: GLUCOPHAGE-XR TAKE 1 TABLET TWICE DAILY   multivitamin with minerals tablet Take 1 tablet by mouth daily.   pravastatin 40 MG tablet Commonly known as: PRAVACHOL TAKE 1 TABLET EVERY DAY   sildenafil 100 MG tablet Commonly known as: Viagra Take 1 tablet (100 mg total) by mouth daily as needed for erectile dysfunction.   Trulicity 1.5 MG/0.5ML Sopn Generic drug: Dulaglutide Inject 1.5 mg into the skin once a week. Inject 1.5mg  under the skin once weekly.   zinc gluconate 50 MG tablet  Take 50 mg by mouth daily.        Allergies: No Known Allergies  Past Medical History:  Diagnosis Date   Arthritis    Diabetes mellitus without complication (HCC)    Erectile dysfunction    Hyperlipidemia    Macular degeneration     Past Surgical History:  Procedure Laterality Date   arthroscopic knee Left    times 2   EYE SURGERY Bilateral     cataract removal   PARTIAL KNEE ARTHROPLASTY Left 07/26/2015   Procedure: LEFT UNICOMPARTMENTAL KNEE ARTHROPLASTY;  Surgeon: Kathryne Hitch, MD;  Location: [facility] OR;  Service: Orthopedics;  Laterality: Left;    Family History  Problem Relation Age of Onset   Diabetes Neg Hx    Heart disease Neg Hx    Stroke Neg Hx    Hypertension Father     Social History:  reports that he has never smoked. He has never used smokeless tobacco. He reports that he does not drink alcohol and does not use drugs.  Review of Systems:  History of high blood pressure:  His blood pressure was unusually high in 11/21 He was then given losartan in 12/21 Prior to starting valsartan blood pressure at home ranged between about 125/78 up to 145/88 Blood pressure still was fluctuating on losartan  However prior to his visit in 6/22 he was occasionally feeling lightheaded and his systolic blood pressure was below 100 systolic More recently his blood pressure at home without any treatment has been mostly near normal although highest reading was 139/81 Blood pressure may be high on the first measurement in the office  BP Readings from Last 3 Encounters:  12/01/20 135/85  08/25/20 118/76  03/08/20 138/86     Has macular degeneration, has had regular eye exams   HYPERLIPIDEMIA: The lipid abnormality consists of elevated LDL .  Has been treated with pravastatin, 40 mg which he is taking daily  LDL is about the same and below 100   Lab Results  Component Value Date   CHOL 162 11/29/2020   CHOL 164 01/25/2020   CHOL 156 04/20/2019   Lab Results  Component Value Date   HDL 59.00 11/29/2020   HDL 68.00 01/25/2020   HDL 57.00 04/20/2019   Lab Results  Component Value Date   LDLCALC 86 11/29/2020   LDLCALC 85 01/25/2020   LDLCALC 87 04/20/2019   Lab Results  Component Value Date   TRIG 84.0 11/29/2020   TRIG 55.0 01/25/2020   TRIG 57.0 04/20/2019   Lab Results  Component Value Date    CHOLHDL 3 11/29/2020   CHOLHDL 2 01/25/2020   CHOLHDL 3 04/20/2019   Lab Results  Component Value Date   LDLDIRECT 144.3 02/16/2014   LDLDIRECT 151.2 02/23/2013   LDLDIRECT 138.1 10/22/2012     He has had long-standing erectile dysfunction,  has been prescribed Viagra with good results He gets his prescription filled from [place], new refill given today     Examination:   BP 135/85   Pulse 84   Ht 6' (1.829 m)   Wt 177 lb 6.4 oz (80.5 kg)   SpO2 96%   BMI 24.06 kg/m   Body mass index is 24.06 kg/m.      ASSESSMENT/ PLAN:   High normal blood pressure:  This appears to be controlled without valsartan now although still appears to have some whitecoat syndrome  Microalbumin had been normal  DIABETES: A1c is back to below 7% with his going back  on his diet especially cutting back on desserts and sweets  He has been regular with Trulicity Also has been more regular with exercise with walking Only once has had a high reading of 231 otherwise blood sugars are relatively good, averaging about 128 in the mornings  History of hypercholesterolemia: Well-controlled with pravastatin which she will continue at a 40 mg dose Triglycerides normal  Recheck A1c in 4 months  There are no Patient Instructions on file for this visit.      Reather Littler 12/05/2020, 11:49 AM

## 2020-12-01 ENCOUNTER — Other Ambulatory Visit: Payer: Self-pay

## 2020-12-01 ENCOUNTER — Encounter: Payer: Self-pay | Admitting: Endocrinology

## 2020-12-01 ENCOUNTER — Ambulatory Visit (INDEPENDENT_AMBULATORY_CARE_PROVIDER_SITE_OTHER): Payer: Medicare PPO | Admitting: Endocrinology

## 2020-12-01 VITALS — BP 135/85 | HR 84 | Ht 72.0 in | Wt 177.4 lb

## 2020-12-01 DIAGNOSIS — R030 Elevated blood-pressure reading, without diagnosis of hypertension: Secondary | ICD-10-CM | POA: Diagnosis not present

## 2020-12-01 DIAGNOSIS — E1165 Type 2 diabetes mellitus with hyperglycemia: Secondary | ICD-10-CM

## 2020-12-01 DIAGNOSIS — E7800 Pure hypercholesterolemia, unspecified: Secondary | ICD-10-CM

## 2020-12-01 MED ORDER — SILDENAFIL CITRATE 100 MG PO TABS: 100.0000 mg | ORAL_TABLET | Freq: Every day | ORAL | 0 refills | Status: DC | PRN

## 2020-12-03 DIAGNOSIS — E7800 Pure hypercholesterolemia, unspecified: Secondary | ICD-10-CM

## 2020-12-03 DIAGNOSIS — E1165 Type 2 diabetes mellitus with hyperglycemia: Secondary | ICD-10-CM

## 2020-12-05 MED ORDER — TRULICITY 1.5 MG/0.5ML ~~LOC~~ SOAJ: 1.5000 mg | SUBCUTANEOUS | 1 refills | Status: DC

## 2020-12-05 MED ORDER — GLIMEPIRIDE 1 MG PO TABS: ORAL_TABLET | ORAL | 1 refills | Status: DC

## 2020-12-05 MED ORDER — METFORMIN HCL ER 750 MG PO TB24: 750.0000 mg | ORAL_TABLET | Freq: Two times a day (BID) | ORAL | 1 refills | Status: DC

## 2020-12-05 MED ORDER — PRAVASTATIN SODIUM 40 MG PO TABS: 40.0000 mg | ORAL_TABLET | Freq: Every day | ORAL | 1 refills | Status: DC

## 2021-04-10 ENCOUNTER — Other Ambulatory Visit: Payer: Self-pay

## 2021-04-10 ENCOUNTER — Other Ambulatory Visit (INDEPENDENT_AMBULATORY_CARE_PROVIDER_SITE_OTHER): Payer: Medicare PPO

## 2021-04-10 DIAGNOSIS — E1165 Type 2 diabetes mellitus with hyperglycemia: Secondary | ICD-10-CM

## 2021-04-10 LAB — BASIC METABOLIC PANEL
BUN: 18 mg/dL (ref 6–23)
CO2: 28 mEq/L (ref 19–32)
Calcium: 9.5 mg/dL (ref 8.4–10.5)
Chloride: 104 mEq/L (ref 96–112)
Creatinine, Ser: 0.92 mg/dL (ref 0.40–1.50)
GFR: 81.17 mL/min (ref >60.00)
Glucose, Bld: 135 mg/dL — ABNORMAL HIGH (ref 70–99)
Potassium: 4.5 mEq/L (ref 3.5–5.1)
Sodium: 142 mEq/L (ref 135–145)

## 2021-04-10 LAB — HEMOGLOBIN A1C: Hgb A1c MFr Bld: 7.3 % — ABNORMAL HIGH (ref 4.6–6.5)

## 2021-04-13 ENCOUNTER — Encounter: Payer: Self-pay | Admitting: Endocrinology

## 2021-04-13 ENCOUNTER — Other Ambulatory Visit: Payer: Self-pay

## 2021-04-13 ENCOUNTER — Ambulatory Visit: Payer: Medicare PPO | Admitting: Endocrinology

## 2021-04-13 VITALS — BP 144/82 | HR 79 | Ht 73.0 in | Wt 183.6 lb

## 2021-04-13 DIAGNOSIS — R030 Elevated blood-pressure reading, without diagnosis of hypertension: Secondary | ICD-10-CM

## 2021-04-13 DIAGNOSIS — E7800 Pure hypercholesterolemia, unspecified: Secondary | ICD-10-CM | POA: Diagnosis not present

## 2021-04-13 DIAGNOSIS — E1165 Type 2 diabetes mellitus with hyperglycemia: Secondary | ICD-10-CM

## 2021-04-13 NOTE — Progress Notes (Signed)
Patient ID: Juan Morales, male   DOB: March 10, 1946, 75 y.o.   MRN: [id number]   Reason for Appointment:  follow-up for various problems  History of Present Illness      Type 2 DIABETES MELITUS  He was initially diagnosed with prediabetes in 2003. Previously had relatively mild diabetes which was previously well-controlled with low-dose Jentadueto or Janumet XR His A1c had been usually under 7%   When his A1c had gone up to 7.6% in 7/15 he was started on Trulicity  0.75 mg weekly.   In 8/18 because of relatively high A1c and fasting sugars he was started on Amaryl 1 mg at dinnertime  RECENT HISTORY:   Current medication regimen: Trulicity 1.5 mg weekly, Amaryl 1 mg at dinner and Metformin ER 1500 mg daily  Current blood sugar patterns, daily management and problems identified:  His A1c which was 6.8 is now up to 7.3   He has not checked readings after his meals and only in the morning  He is still not understanding the need to check readings after meals to help monitor his dietary compliance and overall control  Although he has been walking generally fairly regularly or playing golf he is gaining weight  Only recently he thinks he may be being more consistent with that has diet as far as watching carbohydrates and portions Has not missed any doses of Trulicity  Recent fasting readings are near 100 However not clear how often he is checking his blood sugars since his 90-day average is also about 125  Taking Amaryl and metformin as directed   Side effects from medications:  none  Monitors blood glucose: Once a day or less.    Glucometer: Accucheck   guide       Blood Glucose readings by review of monitor:  AVERAGE for 30 days is 123 RANGE fasting 98-132 recently  PREVIOUSLY:  PRE-MEAL Fasting Lunch Dinner Bedtime Overall  Glucose range: 108-151 114 96  96-231  Mean/median: 128    132/128   POST-MEAL PC Breakfast PC Lunch PC Dinner  Glucose range:      Mean/median:   134     Wt Readings from Last 3 Encounters:  04/13/21 183 lb 9.6 oz (83.3 kg)  12/01/20 177 lb 6.4 oz (80.5 kg)  08/25/20 180 lb 6.4 oz (81.8 kg)   Lab Results  Component Value Date   HGBA1C 7.3 (H) 04/10/2021   HGBA1C 6.8 (H) 11/29/2020   HGBA1C 8.0 (H) 08/23/2020   Lab Results  Component Value Date   MICROALBUR 2.5 (H) 08/23/2020   LDLCALC 86 11/29/2020   CREATININE 0.92 04/10/2021    OTHER active problems: See review of systems  Lab on 04/10/2021  Component Date Value Ref Range Status   Sodium 04/10/2021 142  135 - 145 mEq/L Final   Potassium 04/10/2021 4.5  3.5 - 5.1 mEq/L Final   Chloride 04/10/2021 104  96 - 112 mEq/L Final   CO2 04/10/2021 28  19 - 32 mEq/L Final   Glucose, Bld 04/10/2021 135 (H)  70 - 99 mg/dL Final   BUN 66/59/9357 18  6 - 23 mg/dL Final   Creatinine, Ser 04/10/2021 0.92  0.40 - 1.50 mg/dL Final   GFR 01/77/9390 81.17  >60.00 mL/min Final   Calculated using the CKD-EPI Creatinine Equation (2021)   Calcium 04/10/2021 9.5  8.4 - 10.5 mg/dL Final   Hgb Z0S MFr Bld 04/10/2021 7.3 (H)  4.6 - 6.5 % Final   Glycemic  Control Guidelines for People with Diabetes:Non Diabetic:  <6%Goal of Therapy: <7%Additional Action Suggested:  >8%     Allergies as of 04/13/2021   No Known Allergies      Medication List        Accurate as of April 13, 2021  8:35 PM. If you have any questions, ask your nurse or doctor.          STOP taking these medications    zinc gluconate 50 MG tablet Stopped by: Reather LittlerAjay Liley Rake, MD       TAKE these medications    glimepiride 1 MG tablet Commonly known as: AMARYL Take 1 tablet by mouth daily before supper   glucose blood test strip Commonly known as: Accu-Chek Guide Use accu chek guide test strips as instructed to check blood sugar three times daily.   metFORMIN 750 MG 24 hr tablet Commonly known as: GLUCOPHAGE-XR Take 1 tablet (750 mg total) by mouth 2 (two) times daily.   multivitamin with  minerals tablet Take 1 tablet by mouth daily.   pravastatin 40 MG tablet Commonly known as: PRAVACHOL Take 1 tablet (40 mg total) by mouth daily.   sildenafil 100 MG tablet Commonly known as: Viagra Take 1 tablet (100 mg total) by mouth daily as needed for erectile dysfunction.   Trulicity 1.5 MG/0.5ML Sopn Generic drug: Dulaglutide Inject 1.5 mg into the skin once a week. Inject 1.5mg  under the skin once weekly.        Allergies: No Known Allergies  Past Medical History:  Diagnosis Date   Arthritis    Diabetes mellitus without complication (HCC)    Erectile dysfunction    Hyperlipidemia    Macular degeneration     Past Surgical History:  Procedure Laterality Date   arthroscopic knee Left    times 2   EYE SURGERY Bilateral    cataract removal   PARTIAL KNEE ARTHROPLASTY Left 07/26/2015   Procedure: LEFT UNICOMPARTMENTAL KNEE ARTHROPLASTY;  Surgeon: Kathryne Hitchhristopher Y Blackman, MD;  Location: [facility] OR;  Service: Orthopedics;  Laterality: Left;    Family History  Problem Relation Age of Onset   Diabetes Neg Hx    Heart disease Neg Hx    Stroke Neg Hx    Hypertension Father     Social History:  reports that he has never smoked. He has never used smokeless tobacco. He reports that he does not drink alcohol and does not use drugs.  Review of Systems:  History of high blood pressure:  His blood pressure was unusually high in 11/21 He was then given valsartan in 12/21 Prior to starting valsartan blood pressure at home ranged between about 125/78 up to 145/88 However prior to his visit in 6/22 he was occasionally feeling lightheaded and his systolic blood pressure was below 100 systolic He stopped valsartan at that time  The blood pressure at home has been ranging from about 126-137 systolic and 78-91 diastolic with only rare readings diastolic below 80  BP Readings from Last 3 Encounters:  04/13/21 (!) 144/82  12/01/20 135/85  08/25/20 118/76     Has macular  degeneration, has had regular eye exams   HYPERLIPIDEMIA: The lipid abnormality consists of elevated LDL .  Has been treated with pravastatin, 40 mg, he is taking this regularly  LDL is usually below 100   Lab Results  Component Value Date   CHOL 162 11/29/2020   CHOL 164 01/25/2020   CHOL 156 04/20/2019   Lab Results  Component Value Date  HDL 59.00 11/29/2020   HDL 68.00 01/25/2020   HDL 57.00 04/20/2019   Lab Results  Component Value Date   LDLCALC 86 11/29/2020   LDLCALC 85 01/25/2020   LDLCALC 87 04/20/2019   Lab Results  Component Value Date   TRIG 84.0 11/29/2020   TRIG 55.0 01/25/2020   TRIG 57.0 04/20/2019   Lab Results  Component Value Date   CHOLHDL 3 11/29/2020   CHOLHDL 2 01/25/2020   CHOLHDL 3 04/20/2019   Lab Results  Component Value Date   LDLDIRECT 144.3 02/16/2014   LDLDIRECT 151.2 02/23/2013   LDLDIRECT 138.1 10/22/2012    NEUROPATHY: He has only mild numbness on the right fourth and fifth toes, previously had more significant symptoms  He has had long-standing erectile dysfunction,  has been prescribed Viagra with good results He gets his prescription filled from [place]     Examination:   BP (!) 144/82    Pulse 79    Ht 6\' 1"  (1.854 m)    Wt 183 lb 9.6 oz (83.3 kg)    SpO2 99%    BMI 24.22 kg/m   Body mass index is 24.22 kg/m.     Diabetic Foot Exam - Simple   Simple Foot Form Diabetic Foot exam was performed with the following findings: Yes   Visual Inspection No deformities, no ulcerations, no other skin breakdown bilaterally: Yes Sensation Testing Intact to touch and monofilament testing bilaterally: Yes See comments: Yes Pulse Check Posterior Tibialis and Dorsalis pulse intact bilaterally: Yes Comments Mildly decreased monofilament sensation in the right fourth and fifth toes      ASSESSMENT/ PLAN:  l  DIABETES type II:  He is on a program of metformin, Amaryl and Trulicity 1.5 mg weekly  A1c is relatively  higher at 7.3 He tends to have higher readings if he is not consistent with his diet However not clear if he is having postprandial hyperglycemia which he does not monitor Recent fasting readings are fairly good  Also generally exercising regularly  Although he may benefit from a higher dose of Trulicity he thinks he can work on his diet and improve his control He was given guidelines on when to check his blood sugars and targets for both fasting and after meal readings  ?  HYPERTENSION  He likely has mild hypertension but he is reluctant to start any medications again He thinks his blood pressure may be better if he is cutting back on high sodium foods  We will have him follow-up in 3 months again Meanwhile continue to monitor regularly at home  Patient Instructions  Check blood sugars on waking up 3 days a week  Also check blood sugars about 2 hours after meals and do this after different meals by rotation  Recommended blood sugar levels on waking up are 90-120 and about 2 hours after meal is 130-160  Please bring your blood sugar monitor to each visit, thank you       04/13/2021, 8:35 PM

## 2021-04-13 NOTE — Patient Instructions (Signed)
Check blood sugars on waking up 3 days a week  Also check blood sugars about 2 hours after meals and do this after different meals by rotation  Recommended blood sugar levels on waking up are 90-120 and about 2 hours after meal is 130-160  Please bring your blood sugar monitor to each visit, thank you  

## 2021-05-12 ENCOUNTER — Encounter: Payer: Self-pay | Admitting: Endocrinology

## 2021-05-12 DIAGNOSIS — E1165 Type 2 diabetes mellitus with hyperglycemia: Secondary | ICD-10-CM

## 2021-05-12 DIAGNOSIS — E7800 Pure hypercholesterolemia, unspecified: Secondary | ICD-10-CM

## 2021-05-15 MED ORDER — GLIMEPIRIDE 1 MG PO TABS: ORAL_TABLET | ORAL | 1 refills | Status: DC

## 2021-05-15 MED ORDER — PRAVASTATIN SODIUM 40 MG PO TABS: 40.0000 mg | ORAL_TABLET | Freq: Every day | ORAL | 1 refills | Status: DC

## 2021-05-15 MED ORDER — TRULICITY 1.5 MG/0.5ML ~~LOC~~ SOAJ: 1.5000 mg | SUBCUTANEOUS | 1 refills | Status: DC

## 2021-07-17 ENCOUNTER — Other Ambulatory Visit: Payer: Medicare PPO

## 2021-07-20 ENCOUNTER — Ambulatory Visit: Payer: Medicare PPO | Admitting: Endocrinology

## 2021-08-15 DIAGNOSIS — Z1331 Encounter for screening for depression: Secondary | ICD-10-CM | POA: Diagnosis not present

## 2021-08-15 DIAGNOSIS — Z1211 Encounter for screening for malignant neoplasm of colon: Secondary | ICD-10-CM | POA: Diagnosis not present

## 2021-08-15 DIAGNOSIS — Z1159 Encounter for screening for other viral diseases: Secondary | ICD-10-CM | POA: Diagnosis not present

## 2021-08-15 DIAGNOSIS — Z7985 Long-term (current) use of injectable non-insulin antidiabetic drugs: Secondary | ICD-10-CM | POA: Diagnosis not present

## 2021-08-15 DIAGNOSIS — E1169 Type 2 diabetes mellitus with other specified complication: Secondary | ICD-10-CM | POA: Diagnosis not present

## 2021-08-15 DIAGNOSIS — Z0000 Encounter for general adult medical examination without abnormal findings: Secondary | ICD-10-CM | POA: Diagnosis not present

## 2021-08-15 DIAGNOSIS — E7800 Pure hypercholesterolemia, unspecified: Secondary | ICD-10-CM | POA: Diagnosis not present

## 2021-08-15 DIAGNOSIS — N529 Male erectile dysfunction, unspecified: Secondary | ICD-10-CM | POA: Diagnosis not present

## 2021-08-15 DIAGNOSIS — Z7984 Long term (current) use of oral hypoglycemic drugs: Secondary | ICD-10-CM | POA: Diagnosis not present

## 2021-08-22 ENCOUNTER — Other Ambulatory Visit (INDEPENDENT_AMBULATORY_CARE_PROVIDER_SITE_OTHER): Payer: Medicare PPO

## 2021-08-22 DIAGNOSIS — E7800 Pure hypercholesterolemia, unspecified: Secondary | ICD-10-CM

## 2021-08-22 DIAGNOSIS — E1165 Type 2 diabetes mellitus with hyperglycemia: Secondary | ICD-10-CM | POA: Diagnosis not present

## 2021-08-22 LAB — BASIC METABOLIC PANEL
BUN: 18 mg/dL (ref 6–23)
CO2: 29 mEq/L (ref 19–32)
Calcium: 9.7 mg/dL (ref 8.4–10.5)
Chloride: 103 mEq/L (ref 96–112)
Creatinine, Ser: 0.97 mg/dL (ref 0.40–1.50)
GFR: 75.98 mL/min (ref >60.00)
Glucose, Bld: 170 mg/dL — ABNORMAL HIGH (ref 70–99)
Potassium: 4.3 mEq/L (ref 3.5–5.1)
Sodium: 140 mEq/L (ref 135–145)

## 2021-08-22 LAB — LIPID PANEL
Cholesterol: 160 mg/dL (ref 0–200)
HDL: 56.6 mg/dL (ref >39.00)
LDL Cholesterol: 87 mg/dL (ref 0–99)
NonHDL: 103.34
Total CHOL/HDL Ratio: 3
Triglycerides: 81 mg/dL (ref 0.0–149.0)
VLDL: 16.2 mg/dL (ref 0.0–40.0)

## 2021-08-22 LAB — MICROALBUMIN / CREATININE URINE RATIO
Creatinine,U: 129.6 mg/dL
Microalb Creat Ratio: 1 mg/g (ref 0.0–30.0)
Microalb, Ur: 1.3 mg/dL (ref 0.0–1.9)

## 2021-08-22 LAB — HEMOGLOBIN A1C: Hgb A1c MFr Bld: 7.6 % — ABNORMAL HIGH (ref 4.6–6.5)

## 2021-08-24 ENCOUNTER — Ambulatory Visit: Payer: Medicare PPO | Admitting: Endocrinology

## 2021-08-24 ENCOUNTER — Encounter: Payer: Self-pay | Admitting: Endocrinology

## 2021-08-24 VITALS — BP 124/74 | HR 86 | Ht 72.0 in | Wt 174.8 lb

## 2021-08-24 DIAGNOSIS — E1165 Type 2 diabetes mellitus with hyperglycemia: Secondary | ICD-10-CM

## 2021-08-24 DIAGNOSIS — E7800 Pure hypercholesterolemia, unspecified: Secondary | ICD-10-CM

## 2021-08-24 NOTE — Progress Notes (Unsigned)
Patient ID: Juan Morales, male   DOB: [birth year], 76 y.o.   MRN: [id number]   Reason for Appointment:  follow-up for various problems  History of Present Illness      Type 2 DIABETES MELITUS  He was initially diagnosed with prediabetes in 2003. Previously had relatively mild diabetes which was previously well-controlled with low-dose Jentadueto or Janumet XR His A1c had been usually under 7%   When his A1c had gone up to 7.6% in 7/15 he was started on Trulicity  0.75 mg weekly.   In 8/18 because of relatively high A1c and fasting sugars he was started on Amaryl 1 mg at dinnertime  RECENT HISTORY:   Current medication regimen: Trulicity 1.5 mg weekly, Amaryl 1 mg at dinner and Metformin ER 1500 mg daily  Current blood sugar patterns, daily management and problems identified:  His A1c which was 6.8 is now up to 7.3   He has not checked readings after his meals and only in the morning  He is still not understanding the need to check readings after meals to help monitor his dietary compliance and overall control  Although he has been walking generally fairly regularly or playing golf he is gaining weight  Only recently he thinks he may be being more consistent with that has diet as far as watching carbohydrates and portions Has not missed any doses of Trulicity  Recent fasting readings are near 100 However not clear how often he is checking his blood sugars since his 90-day average is also about 125  Taking Amaryl and metformin as directed   Side effects from medications:  none  Monitors blood glucose: Once a day or less.    Glucometer: Accucheck   guide       Blood Glucose readings by review of monitor:   PRE-MEAL Fasting Lunch Dinner Bedtime Overall  Glucose range: 130-152      Mean/median:        POST-MEAL PC Breakfast PC Lunch PC Dinner  Glucose range:     Mean/median:       AVERAGE for 30 days is 123 RANGE fasting 98-132 recently   Wt Readings  from Last 3 Encounters:  08/24/21 174 lb 12.8 oz (79.3 kg)  04/13/21 183 lb 9.6 oz (83.3 kg)  12/01/20 177 lb 6.4 oz (80.5 kg)   Lab Results  Component Value Date   HGBA1C 7.6 (H) 08/22/2021   HGBA1C 7.3 (H) 04/10/2021   HGBA1C 6.8 (H) 11/29/2020   Lab Results  Component Value Date   MICROALBUR 1.3 08/22/2021   LDLCALC 87 08/22/2021   CREATININE 0.97 08/22/2021    OTHER active problems: See review of systems  Lab on 08/22/2021  Component Date Value Ref Range Status   Cholesterol 08/22/2021 160  0 - 200 mg/dL Final   ATP III Classification       Desirable:  < 200 mg/dL               Borderline High:  200 - 239 mg/dL          High:  > = 854 mg/dL   Triglycerides 62/70/3500 81.0  0.0 - 149.0 mg/dL Final   Normal:  <938 mg/dLBorderline High:  150 - 199 mg/dL   HDL 18/29/9371 69.67  >39.00 mg/dL Final   VLDL 89/38/1017 16.2  0.0 - 40.0 mg/dL Final   LDL Cholesterol 08/22/2021 87  0 - 99 mg/dL Final   Total CHOL/HDL Ratio 08/22/2021 3   Final  Men          Women1/2 Average Risk     3.4          3.3Average Risk          5.0          4.42X Average Risk          9.6          7.13X Average Risk          15.0          11.0                       NonHDL 08/22/2021 103.34   Final   NOTE:  Non-HDL goal should be 30 mg/dL higher than patient's LDL goal (i.e. LDL goal of < 70 mg/dL, would have non-HDL goal of < 100 mg/dL)   Microalb, Ur 16/12/9602 1.3  0.0 - 1.9 mg/dL Final   Creatinine,U 54/11/8117 129.6  mg/dL Final   Microalb Creat Ratio 08/22/2021 1.0  0.0 - 30.0 mg/g Final   Sodium 08/22/2021 140  135 - 145 mEq/L Final   Potassium 08/22/2021 4.3  3.5 - 5.1 mEq/L Final   Chloride 08/22/2021 103  96 - 112 mEq/L Final   CO2 08/22/2021 29  19 - 32 mEq/L Final   Glucose, Bld 08/22/2021 170 (H)  70 - 99 mg/dL Final   BUN 14/78/2956 18  6 - 23 mg/dL Final   Creatinine, Ser 08/22/2021 0.97  0.40 - 1.50 mg/dL Final   GFR 21/30/8657 75.98  >60.00 mL/min Final   Calculated using  the CKD-EPI Creatinine Equation (2021)   Calcium 08/22/2021 9.7  8.4 - 10.5 mg/dL Final   Hgb Q4O MFr Bld 08/22/2021 7.6 (H)  4.6 - 6.5 % Final   Glycemic Control Guidelines for People with Diabetes:Non Diabetic:  <6%Goal of Therapy: <7%Additional Action Suggested:  >8%     Allergies as of 08/24/2021   No Known Allergies      Medication List        Accurate as of August 24, 2021  3:38 PM. If you have any questions, ask your nurse or doctor.          glimepiride 1 MG tablet Commonly known as: AMARYL Take 1 tablet by mouth daily before supper   glucose blood test strip Commonly known as: Accu-Chek Guide Use accu chek guide test strips as instructed to check blood sugar three times daily.   metFORMIN 750 MG 24 hr tablet Commonly known as: GLUCOPHAGE-XR Take 1 tablet (750 mg total) by mouth 2 (two) times daily.   multivitamin with minerals tablet Take 1 tablet by mouth daily.   pravastatin 40 MG tablet Commonly known as: PRAVACHOL Take 1 tablet (40 mg total) by mouth daily.   sildenafil 100 MG tablet Commonly known as: Viagra Take 1 tablet (100 mg total) by mouth daily as needed for erectile dysfunction.   Trulicity 1.5 MG/0.5ML Sopn Generic drug: Dulaglutide Inject 1.5 mg into the skin once a week. Inject 1.5mg  under the skin once weekly.        Allergies: No Known Allergies  Past Medical History:  Diagnosis Date   Arthritis    Diabetes mellitus without complication (HCC)    Erectile dysfunction    Hyperlipidemia    Macular degeneration     Past Surgical History:  Procedure Laterality Date   arthroscopic knee Left    times 2   EYE SURGERY Bilateral  cataract removal   PARTIAL KNEE ARTHROPLASTY Left 07/26/2015   Procedure: LEFT UNICOMPARTMENTAL KNEE ARTHROPLASTY;  Surgeon: Kathryne Hitchhristopher Y Blackman, MD;  [facility];  Service: Orthopedics;  Laterality: Left;    Family History  Problem Relation Age of Onset   Diabetes Neg Hx    Heart disease Neg Hx     Stroke Neg Hx    Hypertension Father     Social History:  reports that he has never smoked. He has never used smokeless tobacco. He reports that he does not drink alcohol and does not use drugs.  Review of Systems:  History of high blood pressure:  His blood pressure was unusually high in 11/21 He was then given valsartan in 12/21 Prior to starting valsartan blood pressure at home ranged between about 125/78 up to 145/88 However prior to his visit in 6/22 he was occasionally feeling lightheaded and his systolic blood pressure was below 100 systolic He stopped valsartan at that time  The blood pressure at home has been ranging from about 126-137 systolic and 78-91 diastolic with only rare readings diastolic below 80  BP Readings from Last 3 Encounters:  08/24/21 124/74  04/13/21 (!) 144/82  12/01/20 135/85     Has macular degeneration, has had regular eye exams   HYPERLIPIDEMIA: The lipid abnormality consists of elevated LDL .  Has been treated with pravastatin, 40 mg, he is taking this regularly  LDL is usually below 100   Lab Results  Component Value Date   CHOL 160 08/22/2021   CHOL 162 11/29/2020   CHOL 164 01/25/2020   Lab Results  Component Value Date   HDL 56.60 08/22/2021   HDL 59.00 11/29/2020   HDL 68.00 01/25/2020   Lab Results  Component Value Date   LDLCALC 87 08/22/2021   LDLCALC 86 11/29/2020   LDLCALC 85 01/25/2020   Lab Results  Component Value Date   TRIG 81.0 08/22/2021   TRIG 84.0 11/29/2020   TRIG 55.0 01/25/2020   Lab Results  Component Value Date   CHOLHDL 3 08/22/2021   CHOLHDL 3 11/29/2020   CHOLHDL 2 01/25/2020   Lab Results  Component Value Date   LDLDIRECT 144.3 02/16/2014   LDLDIRECT 151.2 02/23/2013   LDLDIRECT 138.1 10/22/2012    NEUROPATHY: He has only mild numbness on the right fourth and fifth toes, previously had more significant symptoms  He has had long-standing erectile dysfunction,  has been prescribed  Viagra with good results He gets [place] from [place]     Examination:   BP 124/74   Pulse 86   Ht 6' (1.829 m)   Wt 174 lb 12.8 oz (79.3 kg)   SpO2 98%   BMI 23.71 kg/m   Body mass index is 23.71 kg/m.    ASSESSMENT/ PLAN:  l  DIABETES type II:  He is on a program of metformin, Amaryl and Trulicity 1.5 mg weekly  A1c is relatively higher at 7.6 He tends to have higher readings if he is not consistent with his diet However not clear if he is having postprandial hyperglycemia which he does not monitor Recent fasting readings are fairly good  Also generally exercising regularly  Although he may benefit from a higher dose of Trulicity he thinks he can work on his diet and improve his control He was given guidelines on when to check his blood sugars and targets for both fasting and after meal readings  ?  HYPERTENSION  He likely has mild hypertension  but he is reluctant to start any medications again He thinks his blood pressure may be better if he is cutting back on high sodium foods  We will have him follow-up in 3 months again Meanwhile continue to monitor regularly at home  There are no Patient Instructions on file for this visit.      Reather Littler 08/24/2021, 3:38 PM

## 2021-08-24 NOTE — Patient Instructions (Addendum)
Trulicity 3mg  weekly  Check blood sugars on waking up 3 days a week  Also check blood sugars about 2 hours after meals and do this after different meals by rotation  Recommended blood sugar levels on waking up are 90-130 and about 2 hours after meal is 130-180  Please bring your blood sugar monitor to each visit, thank you

## 2021-08-25 MED ORDER — TRULICITY 3 MG/0.5ML ~~LOC~~ SOAJ: 3.0000 mg | SUBCUTANEOUS | 0 refills | Status: DC

## 2021-10-02 ENCOUNTER — Encounter: Payer: Self-pay | Admitting: Endocrinology

## 2021-10-02 ENCOUNTER — Other Ambulatory Visit: Payer: Self-pay | Admitting: Endocrinology

## 2021-10-02 DIAGNOSIS — E1165 Type 2 diabetes mellitus with hyperglycemia: Secondary | ICD-10-CM

## 2021-11-25 ENCOUNTER — Other Ambulatory Visit: Payer: Self-pay | Admitting: Endocrinology

## 2021-11-25 DIAGNOSIS — E7800 Pure hypercholesterolemia, unspecified: Secondary | ICD-10-CM

## 2021-11-25 DIAGNOSIS — E1165 Type 2 diabetes mellitus with hyperglycemia: Secondary | ICD-10-CM

## 2021-12-04 ENCOUNTER — Other Ambulatory Visit: Payer: Medicare PPO

## 2021-12-07 ENCOUNTER — Ambulatory Visit: Payer: Medicare PPO | Admitting: Endocrinology

## 2021-12-22 ENCOUNTER — Other Ambulatory Visit: Payer: Medicare PPO

## 2021-12-25 ENCOUNTER — Other Ambulatory Visit: Payer: Medicare PPO

## 2021-12-25 ENCOUNTER — Other Ambulatory Visit (INDEPENDENT_AMBULATORY_CARE_PROVIDER_SITE_OTHER): Payer: Medicare PPO

## 2021-12-25 DIAGNOSIS — E1165 Type 2 diabetes mellitus with hyperglycemia: Secondary | ICD-10-CM

## 2021-12-25 LAB — BASIC METABOLIC PANEL
BUN: 24 mg/dL — ABNORMAL HIGH (ref 6–23)
CO2: 25 mEq/L (ref 19–32)
Calcium: 9.5 mg/dL (ref 8.4–10.5)
Chloride: 107 mEq/L (ref 96–112)
Creatinine, Ser: 1.09 mg/dL (ref 0.40–1.50)
GFR: 65.89 mL/min (ref >60.00)
Glucose, Bld: 144 mg/dL — ABNORMAL HIGH (ref 70–99)
Potassium: 4.6 mEq/L (ref 3.5–5.1)
Sodium: 143 mEq/L (ref 135–145)

## 2021-12-25 LAB — HEMOGLOBIN A1C: Hgb A1c MFr Bld: 7.5 % — ABNORMAL HIGH (ref 4.6–6.5)

## 2021-12-26 NOTE — Progress Notes (Unsigned)
Patient ID: Juan Morales, male   DOB: 1945-12-18, 76 y.o.   MRN: [id number]   Reason for Appointment:  follow-up for various problems  History of Present Illness      Type 2 DIABETES MELITUS  He was initially diagnosed with prediabetes in 2003. Previously had relatively mild diabetes which was previously well-controlled with low-dose Jentadueto or Janumet XR His A1c had been usually under 7%   When his A1c had gone up to 7.6% in 7/15 he was started on Trulicity  [id number] mg weekly.   In 8/18 because of relatively high A1c and fasting sugars he was started on Amaryl 1 mg at dinnertime  RECENT HISTORY:   Current medication regimen: Trulicity 3mg  weekly, Amaryl 1 mg at dinner and Metformin ER 1500 mg daily  Current blood sugar patterns, daily management and problems identified:  His A1c is only slightly better at 7.5   On the last visit he was started on a higher dose of Trulicity but he does not have any improved control  He is not checking blood sugars after meals and difficult to know when his blood sugars are higher However his late morning fasting glucose was 144  He thinks that because of not exercising much his blood sugars may be higher and this should improve his control As before diet can be more consistent No recent weight change Has no side effects with Trulicity 3 mg dose Taking Amaryl and metformin as directed   Side effects from medications:  none  Monitors blood glucose: Once a day or less.    Glucometer: Accucheck   guide       Blood Glucose readings by review of monitor:  131 AVERAGE and range 107-158 Previously  PRE-MEAL Fasting Lunch Dinner Bedtime Overall  Glucose range: 130-152  113    Mean/median:     136   POST-MEAL PC Breakfast PC Lunch PC Dinner  Glucose range:  164   Mean/median:       Wt Readings from Last 3 Encounters:  12/27/21 174 lb 9.6 oz (79.2 kg)  08/24/21 174 lb 12.8 oz (79.3 kg)  04/13/21 183 lb 9.6 oz (83.3 kg)    Lab Results  Component Value Date   HGBA1C 7.5 (H) 12/25/2021   HGBA1C 7.6 (H) 08/22/2021   HGBA1C 7.3 (H) 04/10/2021   Lab Results  Component Value Date   MICROALBUR 1.3 08/22/2021   LDLCALC 87 08/22/2021   CREATININE 1.09 12/25/2021    OTHER active problems: See review of systems  Lab on 12/25/2021  Component Date Value Ref Range Status   Sodium 12/25/2021 143  135 - 145 mEq/L Final   Potassium 12/25/2021 4.6  3.5 - 5.1 mEq/L Final   Chloride 12/25/2021 107  96 - 112 mEq/L Final   CO2 12/25/2021 25  19 - 32 mEq/L Final   Glucose, Bld 12/25/2021 144 (H)  70 - 99 mg/dL Final   BUN 12/25/2021 24 (H)  6 - 23 mg/dL Final   Creatinine, Ser 12/25/2021 1.09  0.40 - 1.50 mg/dL Final   GFR 12/25/2021 65.89  >60.00 mL/min Final   Calculated using the CKD-EPI Creatinine Equation (2021)   Calcium 12/25/2021 9.5  8.4 - 10.5 mg/dL Final   Hgb A1c MFr Bld 12/25/2021 7.5 (H)  4.6 - 6.5 % Final   Glycemic Control Guidelines for People with Diabetes:Non Diabetic:  <6%Goal of Therapy: <7%Additional Action Suggested:  >8%     Allergies as of 12/27/2021   No Known  Allergies      Medication List        Accurate as of December 27, 2021  9:03 AM. If you have any questions, ask your nurse or doctor.          glimepiride 1 MG tablet Commonly known as: AMARYL TAKE 1 TABLET EVERY DAY BEFORE SUPPER.   glucose blood test strip Commonly known as: Accu-Chek Guide Use accu chek guide test strips as instructed to check blood sugar three times daily.   metFORMIN 750 MG 24 hr tablet Commonly known as: GLUCOPHAGE-XR TAKE 1 TABLET TWICE DAILY   multivitamin with minerals tablet Take 1 tablet by mouth daily.   PreserVision AREDS Tabs See admin instructions.   pravastatin 40 MG tablet Commonly known as: PRAVACHOL TAKE 1 TABLET EVERY DAY   sildenafil 100 MG tablet Commonly known as: Viagra Take 1 tablet (100 mg total) by mouth daily as needed for erectile dysfunction.   Trulicity 3  GE/9.5MW Sopn Generic drug: Dulaglutide INJECT 3 MG INTO THE SKIN ONCE A WEEK.        Allergies: No Known Allergies  Past Medical History:  Diagnosis Date   Arthritis    Diabetes mellitus without complication ([place])    Erectile dysfunction    Hyperlipidemia    Macular degeneration     Past Surgical History:  Procedure Laterality Date   arthroscopic knee Left    times 2   EYE SURGERY Bilateral    cataract removal   PARTIAL KNEE ARTHROPLASTY Left 07/26/2015   Procedure: LEFT UNICOMPARTMENTAL KNEE ARTHROPLASTY;  Surgeon: Mcarthur Rossetti, MD;  Location: [facility];  Service: Orthopedics;  Laterality: Left;    Family History  Problem Relation Age of Onset   Diabetes Neg Hx    Heart disease Neg Hx    Stroke Neg Hx    Hypertension Father     Social History:  reports that he has never smoked. He has never used smokeless tobacco. He reports that he does not drink alcohol and does not use drugs.  Review of Systems:  History of high blood pressure:  His blood pressure was unusually high in 11/21 He was then given valsartan in 12/21 Prior to starting valsartan blood pressure at home ranged between about 125/78 up to 145/88 However prior to his visit in 6/22 he was occasionally feeling lightheaded and his systolic blood pressure was below 413 systolic Has not been on treatment since then  The blood pressure at home has been ranging from about 244-010 systolic mostly and 27-25 diastolic with only rare readings diastolic above 80  BP Readings from Last 3 Encounters:  12/27/21 (!) 158/100  08/24/21 124/74  04/13/21 (!) 144/82     Has macular degeneration, has had regular eye exams   HYPERLIPIDEMIA: The lipid abnormality consists of elevated LDL .  Has been treated with pravastatin, 40 mg, he is taking this regularly  LDL is last below 100   Lab Results  Component Value Date   CHOL 160 08/22/2021   CHOL 162 11/29/2020   CHOL 164 01/25/2020   Lab Results   Component Value Date   HDL 56.60 08/22/2021   HDL 59.00 11/29/2020   HDL 68.00 01/25/2020   Lab Results  Component Value Date   LDLCALC 87 08/22/2021   LDLCALC 86 11/29/2020   LDLCALC 85 01/25/2020   Lab Results  Component Value Date   TRIG 81.0 08/22/2021   TRIG 84.0 11/29/2020   TRIG 55.0 01/25/2020   Lab Results  Component Value Date   CHOLHDL 3 08/22/2021   CHOLHDL 3 11/29/2020   CHOLHDL 2 01/25/2020   Lab Results  Component Value Date   LDLDIRECT 144.3 02/16/2014   LDLDIRECT 151.2 02/23/2013   LDLDIRECT 138.1 10/22/2012    NEUROPATHY: He has minimal mild numbness on the right fourth and fifth toes, previously had more significant symptoms  He has had long-standing erectile dysfunction,  has been prescribed Viagra with good results He gets his prescription filled from [place]     Examination:   BP (!) 158/100   Pulse 75   Ht 6' (1.829 m)   Wt 174 lb 9.6 oz (79.2 kg)   SpO2 98%   BMI 23.68 kg/m   Body mass index is 23.68 kg/m.    ASSESSMENT/ PLAN:    DIABETES type II:  He is on a regimen of metformin, Amaryl and Trulicity 3.0 mg weekly  A1c is still relatively higher at 7.5 Blood sugars are variable in the morning and low readings after meals available Even though he says he is eating in between meals he can still do some readings postprandially especially after his main meals  Also not exercising which he thinks helps May consider Mounjaro unless his blood sugars are improving next visit after starting exercise again  ?  HYPERTENSION  Appears to have mostly whitecoat syndrome  LIPIDS: Well-controlled previously and will recheck on the next visit  Flu vaccine given  There are no Patient Instructions on file for this visit.      Elayne Snare 12/27/2021, 9:03 AM

## 2021-12-27 ENCOUNTER — Ambulatory Visit: Payer: Medicare PPO | Admitting: Endocrinology

## 2021-12-27 ENCOUNTER — Encounter: Payer: Self-pay | Admitting: Endocrinology

## 2021-12-27 VITALS — BP 158/100 | HR 75 | Ht 72.0 in | Wt 174.6 lb

## 2021-12-27 DIAGNOSIS — E1165 Type 2 diabetes mellitus with hyperglycemia: Secondary | ICD-10-CM

## 2021-12-27 DIAGNOSIS — Z23 Encounter for immunization: Secondary | ICD-10-CM

## 2021-12-27 DIAGNOSIS — R030 Elevated blood-pressure reading, without diagnosis of hypertension: Secondary | ICD-10-CM | POA: Diagnosis not present

## 2021-12-27 NOTE — Patient Instructions (Addendum)
Check cost of Mounjaro before next visit  Check blood sugars on waking up 2-3 days a week  Also check blood sugars about 2 hours after meals and do this after different meals by rotation  Recommended blood sugar levels on waking up are 90-130 and about 2 hours after meal is 130-160  Please bring your blood sugar monitor to each visit, thank you   Eye exam

## 2022-01-26 DIAGNOSIS — H1089 Other conjunctivitis: Secondary | ICD-10-CM | POA: Diagnosis not present

## 2022-02-13 ENCOUNTER — Other Ambulatory Visit: Payer: Self-pay | Admitting: Endocrinology

## 2022-02-28 DIAGNOSIS — H43813 Vitreous degeneration, bilateral: Secondary | ICD-10-CM | POA: Diagnosis not present

## 2022-02-28 DIAGNOSIS — H31003 Unspecified chorioretinal scars, bilateral: Secondary | ICD-10-CM | POA: Diagnosis not present

## 2022-02-28 DIAGNOSIS — H353121 Nonexudative age-related macular degeneration, left eye, early dry stage: Secondary | ICD-10-CM | POA: Diagnosis not present

## 2022-02-28 DIAGNOSIS — H43391 Other vitreous opacities, right eye: Secondary | ICD-10-CM | POA: Diagnosis not present

## 2022-02-28 DIAGNOSIS — H35373 Puckering of macula, bilateral: Secondary | ICD-10-CM | POA: Diagnosis not present

## 2022-02-28 DIAGNOSIS — H353212 Exudative age-related macular degeneration, right eye, with inactive choroidal neovascularization: Secondary | ICD-10-CM | POA: Diagnosis not present

## 2022-04-04 ENCOUNTER — Ambulatory Visit (INDEPENDENT_AMBULATORY_CARE_PROVIDER_SITE_OTHER): Payer: Medicare PPO | Admitting: Orthopaedic Surgery

## 2022-04-04 ENCOUNTER — Ambulatory Visit (INDEPENDENT_AMBULATORY_CARE_PROVIDER_SITE_OTHER): Payer: Medicare PPO

## 2022-04-04 ENCOUNTER — Encounter: Payer: Self-pay | Admitting: Orthopaedic Surgery

## 2022-04-04 DIAGNOSIS — M25571 Pain in right ankle and joints of right foot: Secondary | ICD-10-CM
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
## 2022-04-26 ENCOUNTER — Other Ambulatory Visit (INDEPENDENT_AMBULATORY_CARE_PROVIDER_SITE_OTHER): Payer: Medicare PPO

## 2022-04-26 DIAGNOSIS — E1165 Type 2 diabetes mellitus with hyperglycemia: Secondary | ICD-10-CM

## 2022-04-26 LAB — COMPREHENSIVE METABOLIC PANEL
ALT: 13 U/L (ref 0–53)
AST: 15 U/L (ref 0–37)
Albumin: 4.2 g/dL (ref 3.5–5.2)
Alkaline Phosphatase: 53 U/L (ref 39–117)
BUN: 17 mg/dL (ref 6–23)
CO2: 31 mEq/L (ref 19–32)
Calcium: 9.7 mg/dL (ref 8.4–10.5)
Chloride: 104 mEq/L (ref 96–112)
Creatinine, Ser: 0.97 mg/dL (ref 0.40–1.50)
GFR: 75.62 mL/min (ref >60.00)
Glucose, Bld: 181 mg/dL — ABNORMAL HIGH (ref 70–99)
Potassium: 4.4 mEq/L (ref 3.5–5.1)
Sodium: 142 mEq/L (ref 135–145)
Total Bilirubin: 0.8 mg/dL (ref 0.2–1.2)
Total Protein: 6.6 g/dL (ref 6.0–8.3)

## 2022-04-26 LAB — LIPID PANEL
Cholesterol: 196 mg/dL (ref 0–200)
HDL: 61.5 mg/dL (ref >39.00)
LDL Cholesterol: 115 mg/dL — ABNORMAL HIGH (ref 0–99)
NonHDL: 134.59
Total CHOL/HDL Ratio: 3
Triglycerides: 99 mg/dL (ref 0.0–149.0)
VLDL: 19.8 mg/dL (ref 0.0–40.0)

## 2022-04-26 LAB — HEMOGLOBIN A1C: Hgb A1c MFr Bld: 7.6 % — ABNORMAL HIGH (ref 4.6–6.5)

## 2022-04-30 ENCOUNTER — Ambulatory Visit: Payer: Medicare PPO | Admitting: Endocrinology

## 2022-06-12 ENCOUNTER — Encounter: Payer: Self-pay | Admitting: Orthopaedic Surgery

## 2022-06-12 DIAGNOSIS — M25571 Pain in right ankle and joints of right foot: Secondary | ICD-10-CM

## 2022-06-22 ENCOUNTER — Other Ambulatory Visit: Payer: Self-pay | Admitting: Student

## 2022-06-22 DIAGNOSIS — M25571 Pain in right ankle and joints of right foot: Secondary | ICD-10-CM | POA: Diagnosis not present

## 2022-06-22 DIAGNOSIS — M19071 Primary osteoarthritis, right ankle and foot: Secondary | ICD-10-CM | POA: Diagnosis not present

## 2022-06-24 ENCOUNTER — Other Ambulatory Visit: Payer: Self-pay | Admitting: Endocrinology

## 2022-06-24 DIAGNOSIS — E1165 Type 2 diabetes mellitus with hyperglycemia: Secondary | ICD-10-CM

## 2022-06-26 ENCOUNTER — Ambulatory Visit
Admission: RE | Admit: 2022-06-26 | Discharge: 2022-06-26 | Disposition: A | Discharge status: Home / Self Care | Payer: Medicare PPO | Source: Ambulatory Visit | Attending: Student | Admitting: Student

## 2022-06-26 DIAGNOSIS — M7989 Other specified soft tissue disorders: Secondary | ICD-10-CM | POA: Diagnosis not present

## 2022-06-26 DIAGNOSIS — M25571 Pain in right ankle and joints of right foot: Secondary | ICD-10-CM

## 2022-07-09 ENCOUNTER — Other Ambulatory Visit: Payer: Self-pay | Admitting: Endocrinology

## 2022-07-09 DIAGNOSIS — E1165 Type 2 diabetes mellitus with hyperglycemia: Secondary | ICD-10-CM

## 2022-07-09 DIAGNOSIS — M19071 Primary osteoarthritis, right ankle and foot: Secondary | ICD-10-CM | POA: Diagnosis not present

## 2022-07-31 ENCOUNTER — Other Ambulatory Visit (INDEPENDENT_AMBULATORY_CARE_PROVIDER_SITE_OTHER): Payer: Medicare PPO

## 2022-07-31 ENCOUNTER — Other Ambulatory Visit: Payer: Self-pay | Admitting: Endocrinology

## 2022-07-31 DIAGNOSIS — Z7985 Long-term (current) use of injectable non-insulin antidiabetic drugs: Secondary | ICD-10-CM | POA: Diagnosis not present

## 2022-07-31 DIAGNOSIS — E7800 Pure hypercholesterolemia, unspecified: Secondary | ICD-10-CM

## 2022-07-31 DIAGNOSIS — E1165 Type 2 diabetes mellitus with hyperglycemia: Secondary | ICD-10-CM | POA: Diagnosis not present

## 2022-07-31 DIAGNOSIS — Z7984 Long term (current) use of oral hypoglycemic drugs: Secondary | ICD-10-CM

## 2022-07-31 LAB — HEMOGLOBIN A1C: Hgb A1c MFr Bld: 7.6 % — ABNORMAL HIGH (ref 4.6–6.5)

## 2022-07-31 LAB — LIPID PANEL
Cholesterol: 190 mg/dL (ref 0–200)
HDL: 65.4 mg/dL (ref >39.00)
LDL Cholesterol: 114 mg/dL — ABNORMAL HIGH (ref 0–99)
NonHDL: 124.83
Total CHOL/HDL Ratio: 3
Triglycerides: 55 mg/dL (ref 0.0–149.0)
VLDL: 11 mg/dL (ref 0.0–40.0)

## 2022-07-31 LAB — COMPREHENSIVE METABOLIC PANEL
ALT: 17 U/L (ref 0–53)
AST: 19 U/L (ref 0–37)
Albumin: 4 g/dL (ref 3.5–5.2)
Alkaline Phosphatase: 42 U/L (ref 39–117)
BUN: 22 mg/dL (ref 6–23)
CO2: 29 mEq/L (ref 19–32)
Calcium: 9.7 mg/dL (ref 8.4–10.5)
Chloride: 103 mEq/L (ref 96–112)
Creatinine, Ser: 0.95 mg/dL (ref 0.40–1.50)
GFR: 77.39 mL/min (ref >60.00)
Glucose, Bld: 136 mg/dL — ABNORMAL HIGH (ref 70–99)
Potassium: 4.9 mEq/L (ref 3.5–5.1)
Sodium: 140 mEq/L (ref 135–145)
Total Bilirubin: 0.7 mg/dL (ref 0.2–1.2)
Total Protein: 6.6 g/dL (ref 6.0–8.3)

## 2022-07-31 LAB — MICROALBUMIN / CREATININE URINE RATIO
Creatinine,U: 90.1 mg/dL
Microalb Creat Ratio: 0.8 mg/g (ref 0.0–30.0)
Microalb, Ur: <0.7 mg/dL (ref 0.0–1.9)

## 2022-08-05 ENCOUNTER — Other Ambulatory Visit: Payer: Self-pay | Admitting: Endocrinology

## 2022-08-05 DIAGNOSIS — E1165 Type 2 diabetes mellitus with hyperglycemia: Secondary | ICD-10-CM

## 2022-08-06 ENCOUNTER — Ambulatory Visit: Payer: Medicare PPO | Admitting: Endocrinology

## 2022-08-06 ENCOUNTER — Encounter: Payer: Self-pay | Admitting: Endocrinology

## 2022-08-06 VITALS — BP 132/80 | HR 66 | Ht 72.0 in | Wt 171.0 lb

## 2022-08-06 DIAGNOSIS — Z7985 Long-term (current) use of injectable non-insulin antidiabetic drugs: Secondary | ICD-10-CM

## 2022-08-06 DIAGNOSIS — E119 Type 2 diabetes mellitus without complications: Secondary | ICD-10-CM

## 2022-08-06 DIAGNOSIS — E7800 Pure hypercholesterolemia, unspecified: Secondary | ICD-10-CM | POA: Diagnosis not present

## 2022-08-06 DIAGNOSIS — Z7984 Long term (current) use of oral hypoglycemic drugs: Secondary | ICD-10-CM | POA: Diagnosis not present

## 2022-08-06 DIAGNOSIS — E1165 Type 2 diabetes mellitus with hyperglycemia: Secondary | ICD-10-CM

## 2022-08-06 MED ORDER — TIRZEPATIDE 5 MG/0.5ML ~~LOC~~ SOAJ: 5.0000 mg | SUBCUTANEOUS | 0 refills | Status: DC

## 2022-08-06 MED ORDER — SILDENAFIL CITRATE 100 MG PO TABS: 100.0000 mg | ORAL_TABLET | Freq: Every day | ORAL | 0 refills | Status: DC | PRN

## 2022-08-06 NOTE — Patient Instructions (Signed)
Check blood sugars on waking up 3 days a week  Also check blood sugars about 2 hours after meals and do this after different meals by rotation  Recommended blood sugar levels on waking up are 90-130 and about 2 hours after meal is 130-160  Please bring your blood sugar monitor to each visit, thank you   

## 2022-08-06 NOTE — Progress Notes (Signed)
Patient ID: Juan Morales, male   DOB: 13-Aug-1945, 77 y.o.   MRN: [id number]   Reason for Appointment:  follow-up for various problems  History of Present Illness      Type 2 DIABETES MELITUS  He was initially diagnosed with prediabetes in 2003. Previously had relatively mild diabetes which was previously well-controlled with low-dose Jentadueto or Janumet XR His A1c had been usually under 7%   When his A1c had gone up to 7.6% in 7/15 he was started on Trulicity  0.75 mg weekly.   In 8/18 because of relatively high A1c and fasting sugars he was started on Amaryl 1 mg at dinnertime  RECENT HISTORY:   Current medication regimen: Trulicity 3mg  weekly, Amaryl 1 mg at dinner and Metformin ER 1500 mg daily  Current blood sugar patterns, daily management and problems identified:  His A1c is the same at 7.6   He has been on 3 mg Trulicity since 08/2021 but A1c is about the same As before he is only monitoring his blood sugar in the morning Blood sugars do not appear much different than before He has only a couple readings nonfasting which are similar to the morning readings on those days He has been unable to exercise because of ankle pain and only the last 2 or 3 weeks he has been able to start walking with some walking sticks He thinks his diet has been generally better in the last 6 months Weight is down 3 pounds Taking Amaryl and metformin without side effect or hypoglycemia   Side effects from medications:  none  Monitors blood glucose: Once a day or less.    Glucometer: Accucheck   guide       Blood Glucose readings by review of monitor:   PRE-MEAL Fasting Lunch Dinner Bedtime Overall  Glucose range: 109-165 136, 175     Mean/median:     141   POST-MEAL PC Breakfast PC Lunch PC Dinner  Glucose range:   152  Mean/median:       Previous: 131 AVERAGE and range 107-158    Wt Readings from Last 3 Encounters:  08/06/22 171 lb (77.6 kg)  12/27/21 174 lb 9.6  oz (79.2 kg)  08/24/21 174 lb 12.8 oz (79.3 kg)   Lab Results  Component Value Date   HGBA1C 7.6 (H) 07/31/2022   HGBA1C 7.6 (H) 04/26/2022   HGBA1C 7.5 (H) 12/25/2021   Lab Results  Component Value Date   MICROALBUR <0.7 07/31/2022   LDLCALC 114 (H) 07/31/2022   CREATININE 0.95 07/31/2022    OTHER active problems: See review of systems  Lab on 07/31/2022  Component Date Value Ref Range Status   Cholesterol 07/31/2022 190  0 - 200 mg/dL Final   ATP III Classification       Desirable:  < 200 mg/dL               Borderline High:  200 - 239 mg/dL          High:  > = 696 mg/dL   Triglycerides 29/52/8413 55.0  0.0 - 149.0 mg/dL Final   Normal:  <244 mg/dLBorderline High:  150 - 199 mg/dL   HDL 03/28/7251 66.44  >39.00 mg/dL Final   VLDL 03/47/4259 11.0  0.0 - 40.0 mg/dL Final   LDL Cholesterol 07/31/2022 114 (H)  0 - 99 mg/dL Final   Total CHOL/HDL Ratio 07/31/2022 3   Final  Men          Women1/2 Average Risk     3.4          3.3Average Risk          5.0          4.42X Average Risk          9.6          7.13X Average Risk          15.0          11.0                       NonHDL 07/31/2022 124.83   Final   NOTE:  Non-HDL goal should be 30 mg/dL higher than patient's LDL goal (i.e. LDL goal of < 70 mg/dL, would have non-HDL goal of < 100 mg/dL)   Microalb, Ur 16/12/9602 <0.7  0.0 - 1.9 mg/dL Final   Creatinine,U 54/11/8117 90.1  mg/dL Final   Microalb Creat Ratio 07/31/2022 0.8  0.0 - 30.0 mg/g Final   Sodium 07/31/2022 140  135 - 145 mEq/L Final   Potassium 07/31/2022 4.9  3.5 - 5.1 mEq/L Final   Chloride 07/31/2022 103  96 - 112 mEq/L Final   CO2 07/31/2022 29  19 - 32 mEq/L Final   Glucose, Bld 07/31/2022 136 (H)  70 - 99 mg/dL Final   BUN 14/78/2956 22  6 - 23 mg/dL Final   Creatinine, Ser 07/31/2022 0.95  0.40 - 1.50 mg/dL Final   Total Bilirubin 07/31/2022 0.7  0.2 - 1.2 mg/dL Final   Alkaline Phosphatase 07/31/2022 42  39 - 117 U/L Final   AST 07/31/2022 19   0 - 37 U/L Final   ALT 07/31/2022 17  0 - 53 U/L Final   Total Protein 07/31/2022 6.6  6.0 - 8.3 g/dL Final   Albumin 21/30/8657 4.0  3.5 - 5.2 g/dL Final   GFR 84/69/6295 77.39  >60.00 mL/min Final   Calculated using the CKD-EPI Creatinine Equation (2021)   Calcium 07/31/2022 9.7  8.4 - 10.5 mg/dL Final   Hgb M8U MFr Bld 07/31/2022 7.6 (H)  4.6 - 6.5 % Final   Glycemic Control Guidelines for People with Diabetes:Non Diabetic:  <6%Goal of Therapy: <7%Additional Action Suggested:  >8%     Allergies as of 08/06/2022   No Known Allergies      Medication List        Accurate as of Aug 06, 2022  1:27 PM. If you have any questions, ask your nurse or doctor.          STOP taking these medications    Trulicity 3 MG/0.5ML Sopn Generic drug: Dulaglutide Stopped by: Reather Littler, MD       TAKE these medications    amoxicillin 500 MG capsule Commonly known as: AMOXIL Take 500 mg by mouth 3 (three) times daily.   glimepiride 1 MG tablet Commonly known as: AMARYL TAKE 1 TABLET EVERY DAY BEFORE SUPPER.   glucose blood test strip Commonly known as: Accu-Chek Guide Use accu chek guide test strips as instructed to check blood sugar three times daily.   metFORMIN 750 MG 24 hr tablet Commonly known as: GLUCOPHAGE-XR TAKE 1 TABLET TWICE DAILY   multivitamin with minerals tablet Take 1 tablet by mouth daily.   PreserVision AREDS Tabs See admin instructions.   pravastatin 40 MG tablet Commonly known as: PRAVACHOL TAKE 1 TABLET EVERY DAY   sildenafil 100 MG  tablet Commonly known as: Viagra Take 1 tablet (100 mg total) by mouth daily as needed for erectile dysfunction.   tirzepatide 5 MG/0.5ML Pen Commonly known as: MOUNJARO Inject 5 mg into the skin once a week. Started by: Reather Littler, MD        Allergies: No Known Allergies  Past Medical History:  Diagnosis Date   Arthritis    Diabetes mellitus without complication ([facility])    Erectile dysfunction     Hyperlipidemia    Macular degeneration     Past Surgical History:  Procedure Laterality Date   arthroscopic knee Left    times 2   EYE SURGERY Bilateral    cataract removal   PARTIAL KNEE ARTHROPLASTY Left 07/26/2015   Procedure: LEFT UNICOMPARTMENTAL KNEE ARTHROPLASTY;  Surgeon: Kathryne Hitch, MD;  Location: [facility];  Service: Orthopedics;  Laterality: Left;    Family History  Problem Relation Age of Onset   Diabetes Neg Hx    Heart disease Neg Hx    Stroke Neg Hx    Hypertension Father     Social History:  reports that he has never smoked. He has never used smokeless tobacco. He reports that he does not drink alcohol and does not use drugs.  Review of Systems:  History of high blood pressure:  His blood pressure was unusually high in 11/21 He was then given valsartan in 12/21 Prior to starting valsartan blood pressure at home ranged between about 125/78 up to 145/88 However prior to his visit in 6/22 he was occasionally feeling lightheaded and his systolic blood pressure was below 100 systolic Has not been on treatment since then  The blood pressure at home has been ranging from about 125-135 systolic mostly and 75-85 diastolic with only rare readings diastolic above 80  BP Readings from Last 3 Encounters:  08/06/22 132/80  12/27/21 (!) 158/100  08/24/21 124/74     Has macular degeneration, has had regular eye exams   HYPERLIPIDEMIA: The lipid abnormality consists of elevated LDL .  Has been treated with pravastatin, 40 mg, appears to be taking this regularly  LDL is slightly higher this year   Lab Results  Component Value Date   CHOL 190 07/31/2022   CHOL 196 04/26/2022   CHOL 160 08/22/2021   Lab Results  Component Value Date   HDL 65.40 07/31/2022   HDL 61.50 04/26/2022   HDL 56.60 08/22/2021   Lab Results  Component Value Date   LDLCALC 114 (H) 07/31/2022   LDLCALC 115 (H) 04/26/2022   LDLCALC 87 08/22/2021   Lab Results  Component  Value Date   TRIG 55.0 07/31/2022   TRIG 99.0 04/26/2022   TRIG 81.0 08/22/2021   Lab Results  Component Value Date   CHOLHDL 3 07/31/2022   CHOLHDL 3 04/26/2022   CHOLHDL 3 08/22/2021   Lab Results  Component Value Date   LDLDIRECT 144.3 02/16/2014   LDLDIRECT 151.2 02/23/2013   LDLDIRECT 138.1 10/22/2012    NEUROPATHY: He has minimal mild numbness on the right fourth and fifth toes, previously had more significant symptoms  He has had long-standing erectile dysfunction,  has been prescribed Viagra with good results He gets his prescription filled from [place], new given     Examination:   BP 132/80 (BP Location: Left Arm, Patient Position: Sitting, Cuff Size: Small)   Pulse 66   Ht 6' (1.829 m)   Wt 171 lb (77.6 kg)   SpO2 95%   BMI 23.19 kg/m  Body mass index is 23.19 kg/m.    Diabetic Foot Exam - Simple   Simple Foot Form Diabetic Foot exam was performed with the following findings: Yes   Visual Inspection No deformities, no ulcerations, no other skin breakdown bilaterally: Yes Sensation Testing Intact to touch and monofilament testing bilaterally: Yes Pulse Check Posterior Tibialis and Dorsalis pulse intact bilaterally: Yes Comments      ASSESSMENT/ PLAN:    DIABETES type II:  He is on a regimen of metformin, Amaryl and Trulicity 3.0 mg weekly  A1c is still above target and now 7.6 Previously lowest level has been 6.8 May have some progression of diabetes since he is having higher readings despite no weight gain and generally fairly good diet  Will need to know what his blood sugars are after meals Currently has not exercise as regularly as he used to  He will try to switch to Mounjaro 5 mg Weekly instead of Trulicity for the next month and then consider going up to 7.5 or 10 mg To check readings after meals consistently and will review this again on the next visit, does not need fasting readings daily Information on blood sugar targets given  ?   HYPERTENSION  Blood pressure is controlled now without medications  LIPIDS: LDL is above 100 and may consider going up to 80 mg on the next visit if LDL is still high To have consistently low saturated fat diet   Patient Instructions  Check blood sugars on waking up 3 days a week  Also check blood sugars about 2 hours after meals and do this after different meals by rotation  Recommended blood sugar levels on waking up are 90-130 and about 2 hours after meal is 130-160  Please bring your blood sugar monitor to each visit, thank you       Reather Littler 08/06/2022, 1:27 PM

## 2022-08-07 ENCOUNTER — Encounter: Payer: Self-pay | Admitting: Endocrinology

## 2022-08-08 ENCOUNTER — Encounter: Payer: Self-pay | Admitting: Endocrinology

## 2022-08-08 ENCOUNTER — Other Ambulatory Visit (HOSPITAL_COMMUNITY): Payer: Self-pay | Admitting: Orthopedic Surgery

## 2022-08-13 ENCOUNTER — Encounter: Payer: Self-pay | Admitting: Endocrinology

## 2022-08-14 MED ORDER — TIRZEPATIDE 5 MG/0.5ML ~~LOC~~ SOAJ: 5.0000 mg | SUBCUTANEOUS | 1 refills | Status: DC

## 2022-08-23 ENCOUNTER — Encounter: Payer: Self-pay | Admitting: Endocrinology

## 2022-08-23 DIAGNOSIS — Z0000 Encounter for general adult medical examination without abnormal findings: Secondary | ICD-10-CM | POA: Diagnosis not present

## 2022-08-23 DIAGNOSIS — E119 Type 2 diabetes mellitus without complications: Secondary | ICD-10-CM | POA: Diagnosis not present

## 2022-08-23 DIAGNOSIS — Z1331 Encounter for screening for depression: Secondary | ICD-10-CM | POA: Diagnosis not present

## 2022-08-23 DIAGNOSIS — M25571 Pain in right ankle and joints of right foot: Secondary | ICD-10-CM | POA: Diagnosis not present

## 2022-08-23 DIAGNOSIS — Z1211 Encounter for screening for malignant neoplasm of colon: Secondary | ICD-10-CM | POA: Diagnosis not present

## 2022-08-23 DIAGNOSIS — Z23 Encounter for immunization: Secondary | ICD-10-CM | POA: Diagnosis not present

## 2022-08-23 DIAGNOSIS — N529 Male erectile dysfunction, unspecified: Secondary | ICD-10-CM | POA: Diagnosis not present

## 2022-08-23 DIAGNOSIS — E7800 Pure hypercholesterolemia, unspecified: Secondary | ICD-10-CM | POA: Diagnosis not present

## 2022-08-28 ENCOUNTER — Encounter (HOSPITAL_BASED_OUTPATIENT_CLINIC_OR_DEPARTMENT_OTHER): Payer: Self-pay | Admitting: Orthopedic Surgery

## 2022-08-28 ENCOUNTER — Other Ambulatory Visit: Payer: Self-pay

## 2022-08-30 ENCOUNTER — Encounter (HOSPITAL_BASED_OUTPATIENT_CLINIC_OR_DEPARTMENT_OTHER)
Admission: RE | Admit: 2022-08-30 | Discharge: 2022-08-30 | Disposition: A | Discharge status: Home / Self Care | Payer: Medicare PPO | Source: Ambulatory Visit | Attending: Orthopedic Surgery | Admitting: Orthopedic Surgery

## 2022-08-30 ENCOUNTER — Other Ambulatory Visit: Payer: Self-pay

## 2022-08-30 DIAGNOSIS — Z01818 Encounter for other preprocedural examination: Secondary | ICD-10-CM | POA: Diagnosis not present

## 2022-08-30 DIAGNOSIS — E119 Type 2 diabetes mellitus without complications: Secondary | ICD-10-CM | POA: Diagnosis not present

## 2022-08-30 LAB — BASIC METABOLIC PANEL
Anion gap: 8 (ref 5–15)
BUN: 30 mg/dL — ABNORMAL HIGH (ref 8–23)
CO2: 26 mmol/L (ref 22–32)
Calcium: 9.7 mg/dL (ref 8.9–10.3)
Chloride: 104 mmol/L (ref 98–111)
Creatinine, Ser: 1.04 mg/dL (ref 0.61–1.24)
GFR, Estimated: >60 mL/min (ref >60)
Glucose, Bld: 154 mg/dL — ABNORMAL HIGH (ref 70–99)
Potassium: 4.3 mmol/L (ref 3.5–5.1)
Sodium: 138 mmol/L (ref 135–145)

## 2022-08-30 LAB — SURGICAL PCR SCREEN
MRSA, PCR: NEGATIVE
Staphylococcus aureus: NEGATIVE

## 2022-09-04 DIAGNOSIS — M19071 Primary osteoarthritis, right ankle and foot: Secondary | ICD-10-CM | POA: Diagnosis not present

## 2022-09-06 ENCOUNTER — Ambulatory Visit (HOSPITAL_COMMUNITY): Payer: Medicare PPO

## 2022-09-06 ENCOUNTER — Other Ambulatory Visit: Payer: Self-pay

## 2022-09-06 ENCOUNTER — Encounter (HOSPITAL_BASED_OUTPATIENT_CLINIC_OR_DEPARTMENT_OTHER)
Admission: RE | Disposition: A | Discharge status: Home / Self Care | Payer: Self-pay | Source: Home / Self Care | Attending: Orthopedic Surgery

## 2022-09-06 ENCOUNTER — Ambulatory Visit (HOSPITAL_BASED_OUTPATIENT_CLINIC_OR_DEPARTMENT_OTHER)
Admission: RE | Admit: 2022-09-06 | Discharge: 2022-09-06 | Disposition: A | Discharge status: Home / Self Care | Payer: Medicare PPO | Attending: Orthopedic Surgery | Admitting: Orthopedic Surgery

## 2022-09-06 ENCOUNTER — Encounter (HOSPITAL_BASED_OUTPATIENT_CLINIC_OR_DEPARTMENT_OTHER): Payer: Self-pay | Admitting: Orthopedic Surgery

## 2022-09-06 ENCOUNTER — Ambulatory Visit (HOSPITAL_BASED_OUTPATIENT_CLINIC_OR_DEPARTMENT_OTHER): Payer: Medicare PPO | Admitting: Certified Registered Nurse Anesthetist

## 2022-09-06 DIAGNOSIS — Z01818 Encounter for other preprocedural examination: Secondary | ICD-10-CM

## 2022-09-06 DIAGNOSIS — M19071 Primary osteoarthritis, right ankle and foot: Secondary | ICD-10-CM | POA: Diagnosis not present

## 2022-09-06 DIAGNOSIS — Z7984 Long term (current) use of oral hypoglycemic drugs: Secondary | ICD-10-CM | POA: Insufficient documentation

## 2022-09-06 DIAGNOSIS — M19171 Post-traumatic osteoarthritis, right ankle and foot: Secondary | ICD-10-CM | POA: Diagnosis not present

## 2022-09-06 DIAGNOSIS — M25371 Other instability, right ankle: Secondary | ICD-10-CM | POA: Diagnosis not present

## 2022-09-06 DIAGNOSIS — E119 Type 2 diabetes mellitus without complications: Secondary | ICD-10-CM | POA: Insufficient documentation

## 2022-09-06 DIAGNOSIS — M6701 Short Achilles tendon (acquired), right ankle: Secondary | ICD-10-CM | POA: Diagnosis not present

## 2022-09-06 DIAGNOSIS — M21961 Unspecified acquired deformity of right lower leg: Secondary | ICD-10-CM | POA: Diagnosis not present

## 2022-09-06 DIAGNOSIS — Z981 Arthrodesis status: Secondary | ICD-10-CM | POA: Diagnosis not present

## 2022-09-06 DIAGNOSIS — G8918 Other acute postprocedural pain: Secondary | ICD-10-CM | POA: Diagnosis not present

## 2022-09-06 HISTORY — PX: ACHILLES TENDON LENGTHENING: SHX6455

## 2022-09-06 HISTORY — PX: ANKLE RECONSTRUCTION: SHX1151

## 2022-09-06 HISTORY — PX: TOTAL ANKLE ARTHROPLASTY: SHX811

## 2022-09-06 LAB — GLUCOSE, CAPILLARY
Glucose-Capillary: 140 mg/dL — ABNORMAL HIGH (ref 70–99)
Glucose-Capillary: 125 mg/dL — ABNORMAL HIGH (ref 70–99)

## 2022-09-06 SURGERY — ARTHROPLASTY, ANKLE, TOTAL
Anesthesia: General | Site: Ankle | Laterality: Right

## 2022-09-06 MED ORDER — OXYCODONE HCL 5 MG PO TABS: 5.0000 mg | ORAL_TABLET | ORAL | 0 refills | Status: AC | PRN

## 2022-09-06 MED ORDER — ONDANSETRON HCL 4 MG/2ML IJ SOLN: 4.0000 mg | Freq: Four times a day (QID) | INTRAMUSCULAR | Status: DC | PRN

## 2022-09-06 MED ORDER — PROPOFOL 500 MG/50ML IV EMUL
INTRAVENOUS | Status: DC | PRN
  Administered 2022-09-06: 25 ug/kg/min via INTRAVENOUS

## 2022-09-06 MED ORDER — DOCUSATE SODIUM 100 MG PO CAPS: 100.0000 mg | ORAL_CAPSULE | Freq: Two times a day (BID) | ORAL | 0 refills | Status: DC

## 2022-09-06 MED ORDER — PHENYLEPHRINE HCL (PRESSORS) 10 MG/ML IV SOLN
INTRAVENOUS | Status: DC | PRN
  Administered 2022-09-06: 40 ug via INTRAVENOUS
  Administered 2022-09-06 (×2): 80 ug via INTRAVENOUS

## 2022-09-06 MED ORDER — ROPIVACAINE HCL 5 MG/ML IJ SOLN
INTRAMUSCULAR | Status: DC | PRN
  Administered 2022-09-06: 15 mL via PERINEURAL
  Administered 2022-09-06: 25 mL via PERINEURAL

## 2022-09-06 MED ORDER — 0.9 % SODIUM CHLORIDE (POUR BTL) OPTIME
TOPICAL | Status: DC | PRN
  Administered 2022-09-06: 400 mL

## 2022-09-06 MED ORDER — MIDAZOLAM HCL 2 MG/2ML IJ SOLN
1.0000 mg | Freq: Once | INTRAMUSCULAR | Status: AC
  Administered 2022-09-06: 1 mg via INTRAVENOUS

## 2022-09-06 MED ORDER — OXYCODONE HCL 5 MG PO TABS: 5.0000 mg | ORAL_TABLET | Freq: Once | ORAL | Status: DC | PRN

## 2022-09-06 MED ORDER — CEFAZOLIN SODIUM-DEXTROSE 2-4 GM/100ML-% IV SOLN
2.0000 g | INTRAVENOUS | Status: AC
  Administered 2022-09-06: 2 g via INTRAVENOUS

## 2022-09-06 MED ORDER — FENTANYL CITRATE (PF) 100 MCG/2ML IJ SOLN
50.0000 ug | Freq: Once | INTRAMUSCULAR | Status: AC
  Administered 2022-09-06: 50 ug via INTRAVENOUS

## 2022-09-06 MED ORDER — LACTATED RINGERS IV SOLN: INTRAVENOUS | Status: DC

## 2022-09-06 MED ORDER — DEXAMETHASONE SODIUM PHOSPHATE 10 MG/ML IJ SOLN
INTRAMUSCULAR | Status: DC | PRN
  Administered 2022-09-06: 4 mg via INTRAVENOUS

## 2022-09-06 MED ORDER — PROPOFOL 10 MG/ML IV BOLUS
INTRAVENOUS | Status: DC | PRN
  Administered 2022-09-06: 150 mg via INTRAVENOUS

## 2022-09-06 MED ORDER — FENTANYL CITRATE (PF) 100 MCG/2ML IJ SOLN: 25.0000 ug | INTRAMUSCULAR | Status: DC | PRN

## 2022-09-06 MED ORDER — VANCOMYCIN HCL 500 MG IV SOLR
INTRAVENOUS | Status: DC | PRN
  Administered 2022-09-06: 500 mg via TOPICAL

## 2022-09-06 MED ORDER — ONDANSETRON HCL 4 MG/2ML IJ SOLN
INTRAMUSCULAR | Status: DC | PRN
  Administered 2022-09-06: 4 mg via INTRAVENOUS

## 2022-09-06 MED ORDER — CEFAZOLIN SODIUM-DEXTROSE 2-4 GM/100ML-% IV SOLN
INTRAVENOUS | Status: AC
  Filled 2022-09-06: qty 100

## 2022-09-06 MED ORDER — OXYCODONE HCL 5 MG/5ML PO SOLN: 5.0000 mg | Freq: Once | ORAL | Status: DC | PRN

## 2022-09-06 MED ORDER — MIDAZOLAM HCL 2 MG/2ML IJ SOLN
INTRAMUSCULAR | Status: AC
  Filled 2022-09-06: qty 2

## 2022-09-06 MED ORDER — FENTANYL CITRATE (PF) 100 MCG/2ML IJ SOLN
INTRAMUSCULAR | Status: AC
  Filled 2022-09-06: qty 2

## 2022-09-06 MED ORDER — RIVAROXABAN 10 MG PO TABS: 10.0000 mg | ORAL_TABLET | Freq: Every day | ORAL | 0 refills | Status: DC

## 2022-09-06 MED ORDER — SENNA 8.6 MG PO TABS: 2.0000 | ORAL_TABLET | Freq: Two times a day (BID) | ORAL | 0 refills | Status: DC

## 2022-09-06 MED ORDER — SODIUM CHLORIDE 0.9 % IV SOLN: INTRAVENOUS | Status: DC

## 2022-09-06 MED ORDER — LIDOCAINE HCL (CARDIAC) PF 100 MG/5ML IV SOSY
PREFILLED_SYRINGE | INTRAVENOUS | Status: DC | PRN
  Administered 2022-09-06: 30 mg via INTRAVENOUS

## 2022-09-06 SURGICAL SUPPLY — 77 items
ANCH SUT 1 SHRT SM RGD INSRTR (Anchor) ×2 IMPLANT
ANCHOR SUT 1.45 SZ 1 SHORT (Anchor) IMPLANT
APL PRP STRL LF DISP 70% ISPRP (MISCELLANEOUS) ×2
BANDAGE ESMARK 6X9 LF (GAUZE/BANDAGES/DRESSINGS) IMPLANT
BLADE RECIPRO TAPERED (BLADE) ×2 IMPLANT
BLADE SAW OSC ANKLE 8X63X1.19 (BLADE) IMPLANT
BLADE SAW RECIP ANKLE 8X50X1 (PIN) IMPLANT
BLADE SURG 15 STRL LF DISP TIS (BLADE) ×6 IMPLANT
BLADE SURG 15 STRL SS (BLADE) ×6
BNDG CMPR 5X4 KNIT ELC UNQ LF (GAUZE/BANDAGES/DRESSINGS) ×4
BNDG CMPR 5X62 HK CLSR LF (GAUZE/BANDAGES/DRESSINGS)
BNDG CMPR 6"X 5 YARDS HK CLSR (GAUZE/BANDAGES/DRESSINGS)
BNDG CMPR 9X6 STRL LF SNTH (GAUZE/BANDAGES/DRESSINGS)
BNDG ELASTIC 4INX 5YD STR LF (GAUZE/BANDAGES/DRESSINGS) ×4 IMPLANT
BNDG ELASTIC 6INX 5YD STR LF (GAUZE/BANDAGES/DRESSINGS) IMPLANT
BNDG ESMARK 6X9 LF (GAUZE/BANDAGES/DRESSINGS)
CHLORAPREP W/TINT 26 (MISCELLANEOUS) ×2 IMPLANT
CLIP LOCK TOTAL ANKLE SZ4 (Orthopedic Implant) IMPLANT
COVER BACK TABLE 60X90IN (DRAPES) ×4 IMPLANT
COVER MAYO STAND STRL (DRAPES) IMPLANT
CUFF TOURN SGL QUICK 34 (TOURNIQUET CUFF) ×2
CUFF TRNQT CYL 34X4.125X (TOURNIQUET CUFF) IMPLANT
DRAPE C-ARM 42X72 X-RAY (DRAPES) ×2 IMPLANT
DRAPE C-ARMOR (DRAPES) ×2 IMPLANT
DRAPE EXTREMITY T 121X128X90 (DISPOSABLE) ×2 IMPLANT
DRAPE U-SHAPE 47X51 STRL (DRAPES) ×2 IMPLANT
DRESSING MEPILEX FLEX 4X4 (GAUZE/BANDAGES/DRESSINGS) ×2 IMPLANT
DRSG MEPILEX FLEX 4X4 (GAUZE/BANDAGES/DRESSINGS) ×2
DRSG MEPITEL 4X7.2 (GAUZE/BANDAGES/DRESSINGS) ×2 IMPLANT
ELECT REM PT RETURN 9FT ADLT (ELECTROSURGICAL) ×2
ELECTRODE REM PT RTRN 9FT ADLT (ELECTROSURGICAL) ×2 IMPLANT
GAUZE PAD ABD 8X10 STRL (GAUZE/BANDAGES/DRESSINGS) ×4 IMPLANT
GAUZE SPONGE 4X4 12PLY STRL (GAUZE/BANDAGES/DRESSINGS) ×2 IMPLANT
GLOVE BIO SURGEON STRL SZ8 (GLOVE) ×2 IMPLANT
GLOVE BIOGEL PI IND STRL 7.0 (GLOVE) IMPLANT
GLOVE BIOGEL PI IND STRL 8 (GLOVE) ×4 IMPLANT
GLOVE ECLIPSE 8.0 STRL XLNG CF (GLOVE) ×2 IMPLANT
GLOVE SURG SS PI 7.0 STRL IVOR (GLOVE) IMPLANT
GOWN STRL REUS W/ TWL LRG LVL3 (GOWN DISPOSABLE) ×2 IMPLANT
GOWN STRL REUS W/ TWL XL LVL3 (GOWN DISPOSABLE) ×2 IMPLANT
GOWN STRL REUS W/TWL LRG LVL3 (GOWN DISPOSABLE) ×2
GOWN STRL REUS W/TWL XL LVL3 (GOWN DISPOSABLE) ×4 IMPLANT
IMPL TOTAL ANKLE TALAR SZ3 (Orthopedic Implant) IMPLANT
IMPLANT TOTAL ANKLE TALAR SZ3 (Orthopedic Implant) ×2 IMPLANT
INSERT TIB ANKLE SZ3 RT.6 (Orthopedic Implant) IMPLANT
NDL HYPO 22X1.5 SAFETY MO (MISCELLANEOUS) IMPLANT
NEEDLE HYPO 22X1.5 SAFETY MO (MISCELLANEOUS) IMPLANT
NS IRRIG 1000ML POUR BTL (IV SOLUTION) ×2 IMPLANT
PACK BASIN DAY SURGERY FS (CUSTOM PROCEDURE TRAY) ×2 IMPLANT
PAD CAST 4YDX4 CTTN HI CHSV (CAST SUPPLIES) ×4 IMPLANT
PADDING CAST COTTON 4X4 STRL (CAST SUPPLIES) ×4
PADDING CAST COTTON 6X4 STRL (CAST SUPPLIES) ×2 IMPLANT
PENCIL SMOKE EVACUATOR (MISCELLANEOUS) ×2 IMPLANT
PLATE VANTAGE TOTAL ANKLE SZ4 (Plate) IMPLANT
SANITIZER HAND PURELL FF 515ML (MISCELLANEOUS) ×2 IMPLANT
SAW STRYKER ANKLE 10X75X1.19 (BLADE) IMPLANT
SCOTCHCAST PLUS 3X4 WHITE (CAST SUPPLIES) IMPLANT
SCOTCHCAST PLUS 4X4 WHITE (CAST SUPPLIES) IMPLANT
SHEET MEDIUM DRAPE 40X70 STRL (DRAPES) ×2 IMPLANT
SLEEVE SCD COMPRESS KNEE MED (STOCKING) ×2 IMPLANT
SPIKE FLUID TRANSFER (MISCELLANEOUS) IMPLANT
SPONGE T-LAP 18X18 ~~LOC~~+RFID (SPONGE) ×2 IMPLANT
STOCKINETTE 6 STRL (DRAPES) ×2 IMPLANT
SUCTION TUBE FRAZIER 10FR DISP (SUCTIONS) IMPLANT
SURGILUBE 2OZ TUBE FLIPTOP (MISCELLANEOUS) ×2 IMPLANT
SUT ETHILON 3 0 PS 1 (SUTURE) ×4 IMPLANT
SUT MNCRL AB 3-0 PS2 18 (SUTURE) ×4 IMPLANT
SUT VIC AB 0 CT1 27 (SUTURE) ×2
SUT VIC AB 0 CT1 27XBRD ANBCTR (SUTURE) ×2 IMPLANT
SUT VIC AB 2-0 SH 27 (SUTURE) ×2
SUT VIC AB 2-0 SH 27XBRD (SUTURE) IMPLANT
SYR 20ML LL LF (SYRINGE) IMPLANT
SYR BULB IRRIG 60ML STRL (SYRINGE) ×2 IMPLANT
TOWEL GREEN STERILE FF (TOWEL DISPOSABLE) ×4 IMPLANT
TUBE CONNECTING 20X1/4 (TUBING) ×2 IMPLANT
UNDERPAD 30X36 HEAVY ABSORB (UNDERPADS AND DIAPERS) ×2 IMPLANT
YANKAUER SUCT BULB TIP NO VENT (SUCTIONS) ×2 IMPLANT

## 2022-09-06 NOTE — Op Note (Signed)
09/06/2022  11:50 AM  PATIENT:  Juan Morales  77 y.o. male  PRE-OPERATIVE DIAGNOSIS: 1.  Right ankle posttraumatic arthritis      2.  Tight right heel cord  POST-OPERATIVE DIAGNOSIS: Same 3.  Right ankle lateral ligament instability  Procedure(s): 1.  Right total ankle replacement 2.  Right percutaneous tendo Achilles lengthening 3.  Right ankle lateral ligament reconstruction  SURGEON:  Toni Arthurs, MD  ASSISTANT: Alfredo Martinez, PA-C  ANESTHESIA:   General, regional  EBL:  minimal   TOURNIQUET:   Total Tourniquet Time Documented: Thigh (Right) - 109 minutes Total: Thigh (Right) - 109 minutes  COMPLICATIONS:  None apparent  DISPOSITION:  Extubated, awake and stable to recovery.  INDICATION FOR PROCEDURE: 77 year old male with past medical history significant for diabetes complains of worsening right ankle pain over the last several years.  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  He also has a tight heel cord.  He has failed nonoperative treatments.  He presents today for right Achilles tendon lengthening and total ankle replacement.  The risks and benefits of the alternative treatment options have been discussed in detail.  The patient wishes to proceed with surgery and specifically understands risks of bleeding, infection, nerve damage, blood clots, need for additional surgery, amputation and death.   PROCEDURE IN DETAIL:  After pre operative consent was obtained, and the correct operative site was identified, the patient was brought to the operating room and placed supine on the OR table.  Anesthesia was administered.  Pre-operative antibiotics were administered.  A surgical timeout was taken.  The right lower extremity was prepped and draped in standard sterile fashion with a tourniquet around the thigh.  The extremity was elevated, and the tourniquet was inflated to 250 mmHg.  An incision was then made over the EHL tendon centered on the ankle  joint. Dissection was carried sharply down through the subcutaneous tissues.  The retinaculum was incised with the EHL and released proximally distally.  The interval between the EHL and tibialis anterior was then developed.  The neurovascular bundle was mobilized and protected throughout the case.  The anterior joint capsule was incised and elevated medially and laterally.  Anterior osteophytes were resected with an osteotome.  The tibial alignment guide was then attached to the tibia with a guidepin proximally.  Rotation was set in line with the medial gutter.  The guide was aligned with the tibial crest.  AP radiograph confirms appropriate alignment of the guide.  The block was then provisionally pinned.  Lateral view was obtained.  The resection height and slope was set appropriately and cutting block..  The alignment was then set with a cutting block in the AP plane and the block pinned appropriately.  The distal tibial cut was made with the oscillating and reciprocating saw was.  The bone was removed.  The guide was then repositioned for talar cut.  A lateral view was obtained confirming appropriate alignment of the cut.  The cutting block was pinned.  The talar cut was made.  The waste bone was removed.  The size 3 sizing block was inserted and was noted to fit appropriately.  The tibial guide was removed.  The talar lollipop was inserted.  A size 3 was appropriate and was pinned after a lateral radiograph confirmed appropriate position.  The lollipop was removed and replaced with the cutting guide.  The cutting guide was pinned.  The posterior chamfer cut was made followed by milling the anterior slots.  The anterior chamfer cut was then made and the guide removed.  The cut surfaces of bone were smoothed with a rasp.  The talar trial was implanted and was noted to fit appropriately.  It was centered and a screw inserted holding it in place.  The tibial trial was then inserted and was noted to fit  appropriately.  It was pinned in place as well.  A central cyst was identified where the central lug hole fit.  The peripheral lug holes were punched.  A trial poly was inserted and was noted to fit appropriately.  The lug holes in the talus were drilled.  All of the provisional implants were removed.  The cyst in the tibia was then debrided with a curette.  Autograft bone from the distal tibia was impacted into the cyst.  Wound was irrigated copiously.  The tibial implant was inserted and impacted into position.  Radiographs confirmed appropriate position of the implant.  The talar implant was then inserted and impacted into position.  Appropriate position was confirmed with radiographs.  The trial poly spacer was inserted.  Heel cord was noted to be tight.  A percutaneous triple hemisection Achilles tendon lengthening was performed percutaneously with a #15 scalpel.  The ankle was then dorsiflexed appropriately with the knee extended.  The deltoid ligament was noted to be tight and the lateral ligaments were rather lax.  The deltoid was released which improved to the ligamentous balance.  The lateral ligaments were still loose though requiring lateral ligament reconstruction.  The trial poly was removed.  Wound was irrigated copiously.  Vancomycin powder and then placed on the cut surfaces of bone.  The final poly spacer was inserted.  This was a 6 mm spacer.  The locking clip was inserted and was noted to fit appropriately.  Final AP, mortise and lateral radiographs confirmed appropriate alignment of the implants.  A guidepin was drilled into the distal fibula at the level of the ATFL origin.  #1 Short rigid juggernaut anchor was inserted and impacted.  It was noted to have appropriate purchase.  The 2 limbs of suture were passed through the ATFL.  The ankle was dorsiflexed to neutral and everted maximally.  The sutures were tied reconstructing the ATFL ligament.  The wound was then sprinkled with vancomycin  powder.  The joint capsule was repaired with 0 Vicryl.  The retinaculum was repaired with 0 Vicryl.  Subcutaneous tissues were approximated with 3-0 Monocryl.  Skin incision was closed with 3-0 nylon.  Sterile dressings were applied followed by a well-padded short leg cast.  Tourniquet was released after application of the dressings.  The patient was awakened from anesthesia and transported to the recovery room in stable condition.  FOLLOW UP PLAN: Nonweightbearing on the right lower extremity.  Follow-up in the office in 3 weeks for suture removal and conversion to a cam boot.  Xarelto for DVT prophylaxis.   Alfredo Martinez PA-C was present and scrubbed for the duration of the operative case. His assistance was essential in positioning the patient, prepping and draping, gaining and maintaining exposure, performing the operation, closing and dressing the wounds and applying the splint.

## 2022-09-06 NOTE — Anesthesia Preprocedure Evaluation (Signed)
Anesthesia Evaluation  Patient identified by MRN, date of birth, ID band Patient awake    Reviewed: Allergy & Precautions, H&P , NPO status , Patient's Chart, lab work & pertinent test results  Airway Mallampati: II   Neck ROM: full    Dental   Pulmonary neg pulmonary ROS   breath sounds clear to auscultation       Cardiovascular negative cardio ROS  Rhythm:regular Rate:Normal     Neuro/Psych    GI/Hepatic   Endo/Other  diabetes, Type 2    Renal/GU      Musculoskeletal  (+) Arthritis ,    Abdominal   Peds  Hematology   Anesthesia Other Findings   Reproductive/Obstetrics                             Anesthesia Physical Anesthesia Plan  ASA: 2  Anesthesia Plan: General   Post-op Pain Management: Regional block*   Induction: Intravenous  PONV Risk Score and Plan: 2 and Ondansetron, Dexamethasone and Treatment may vary due to age or medical condition  Airway Management Planned: LMA  Additional Equipment:   Intra-op Plan:   Post-operative Plan: Extubation in OR  Informed Consent: I have reviewed the patients History and Physical, chart, labs and discussed the procedure including the risks, benefits and alternatives for the proposed anesthesia with the patient or authorized representative who has indicated his/her understanding and acceptance.     Dental advisory given  Plan Discussed with: CRNA, Anesthesiologist and Surgeon  Anesthesia Plan Comments:        Anesthesia Quick Evaluation

## 2022-09-06 NOTE — Anesthesia Procedure Notes (Signed)
Anesthesia Regional Block: Popliteal block   Pre-Anesthetic Checklist: , timeout performed,  Correct Patient, Correct Site, Correct Laterality,  Correct Procedure, Correct Position, site marked,  Risks and benefits discussed,  Surgical consent,  Pre-op evaluation,  At surgeon's request and post-op pain management  Laterality: Right  Prep: chloraprep       Needles:  Injection technique: Single-shot  Needle Type: Echogenic Stimulator Needle          Additional Needles:   Procedures:, nerve stimulator,,,,,     Nerve Stimulator or Paresthesia:  Response: plantar flexion of foot, 0.45 mA  Additional Responses:   Narrative:  Start time: 09/06/2022 7:45 AM End time: 09/06/2022 7:52 AM Injection made incrementally with aspirations every 5 mL.  Performed by: Personally  Anesthesiologist: Achille Rich, MD  Additional Notes: Functioning IV was confirmed and monitors were applied.  A 90mm 21ga Arrow echogenic stimulator needle was used. Sterile prep and drape,hand hygiene and sterile gloves were used.  Negative aspiration and negative test dose prior to incremental administration of local anesthetic. The patient tolerated the procedure well.  Ultrasound guidance: relevent anatomy identified, needle position confirmed, local anesthetic spread visualized around nerve(s), vascular puncture avoided.  Image printed for medical record.

## 2022-09-06 NOTE — Anesthesia Procedure Notes (Signed)
Anesthesia Regional Block: Adductor canal block   Pre-Anesthetic Checklist: , timeout performed,  Correct Patient, Correct Site, Correct Laterality,  Correct Procedure, Correct Position, site marked,  Risks and benefits discussed,  Surgical consent,  Pre-op evaluation,  At surgeon's request and post-op pain management  Laterality: Right  Prep: chloraprep       Needles:  Injection technique: Single-shot  Needle Type: Echogenic Needle     Needle Length: 9cm  Needle Gauge: 21     Additional Needles:   Narrative:  Start time: 09/06/2022 7:52 AM End time: 09/06/2022 7:56 AM Injection made incrementally with aspirations every 5 mL.  Performed by: Personally  Anesthesiologist: Achille Rich, MD  Additional Notes: Pt tolerated the procedure well.

## 2022-09-06 NOTE — Progress Notes (Signed)
Assisted Dr. Hodierne with right, adductor canal, popliteal, ultrasound guided block. Side rails up, monitors on throughout procedure. See vital signs in flow sheet. Tolerated Procedure well. ?

## 2022-09-06 NOTE — Transfer of Care (Signed)
Immediate Anesthesia Transfer of Care Note  Patient: Juan Morales  Procedure(s) Performed: RIGHT TOTAL ANKLE ARTHROPLASTY (Right: Ankle) ACHILLES TENDON LENGTHENING (Right: Ankle) LATERAL LIGAMENT RECONSTRUCTION (Ankle)  Patient Location: PACU  Anesthesia Type:GA combined with regional for post-op pain  Level of Consciousness: drowsy and patient cooperative  Airway & Oxygen Therapy: Patient Spontanous Breathing and Patient connected to nasal cannula oxygen  Post-op Assessment: Report given to RN and Post -op Vital signs reviewed and stable  Post vital signs: Reviewed and stable  Last Vitals:  Vitals Value Taken Time  BP    Temp    Pulse 69 09/06/22 1152  Resp 12 09/06/22 1152  SpO2 99 % 09/06/22 1152  Vitals shown include unvalidated device data.  Last Pain:  Vitals:   09/06/22 0637  TempSrc: Temporal  PainSc: 0-No pain      Patients Stated Pain Goal: 3 (09/06/22 1610)  Complications: No notable events documented.

## 2022-09-06 NOTE — H&P (Signed)
Juan Morales is an 77 y.o. male.   Chief Complaint: right ankel pain HPI: 77 y/o male with PMH Of diabetes c/o worsening right ankle pain.  He has end stage ankle arthritis and has failed non op treatment including activity modification, oral NSAIDs, bracing and shoewear modification.  He presents today for surgery.  Past Medical History:  Diagnosis Date   Arthritis    Diabetes mellitus without complication (HCC)    Erectile dysfunction    Hyperlipidemia    Macular degeneration     Past Surgical History:  Procedure Laterality Date   arthroscopic knee Left    times 2   EYE SURGERY Bilateral    cataract removal   PARTIAL KNEE ARTHROPLASTY Left 07/26/2015   Procedure: LEFT UNICOMPARTMENTAL KNEE ARTHROPLASTY;  Surgeon: Kathryne Hitch, MD;  Location: [facility] OR;  Service: Orthopedics;  Laterality: Left;    Family History  Problem Relation Age of Onset   Diabetes Neg Hx    Heart disease Neg Hx    Stroke Neg Hx    Hypertension Father    Social History:  reports that he has never smoked. He has never used smokeless tobacco. He reports that he does not drink alcohol and does not use drugs.  Allergies: No Known Allergies  Medications Prior to Admission  Medication Sig Dispense Refill   amoxicillin (AMOXIL) 500 MG capsule Take 500 mg by mouth 3 (three) times daily.     glimepiride (AMARYL) 1 MG tablet TAKE 1 TABLET EVERY DAY BEFORE SUPPER. 90 tablet 1   metFORMIN (GLUCOPHAGE-XR) 750 MG 24 hr tablet TAKE 1 TABLET TWICE DAILY 90 tablet 0   Multiple Vitamins-Minerals (MULTIVITAMIN WITH MINERALS) tablet Take 1 tablet by mouth daily.     Multiple Vitamins-Minerals (PRESERVISION AREDS) TABS See admin instructions.     pravastatin (PRAVACHOL) 40 MG tablet TAKE 1 TABLET EVERY DAY 90 tablet 2   sildenafil (VIAGRA) 100 MG tablet Take 1 tablet (100 mg total) by mouth daily as needed for erectile dysfunction. 40 tablet 0   glucose blood (ACCU-CHEK GUIDE) test strip Use accu chek guide test  strips as instructed to check blood sugar three times daily. 300 each 3   tirzepatide (MOUNJARO) 5 MG/0.5ML Pen Inject 5 mg into the skin once a week. 2 mL 1    Results for orders placed or performed during the hospital encounter of 09/06/22 (from the past 48 hour(s))  Glucose, capillary     Status: Abnormal   Collection Time: 09/06/22  6:48 AM  Result Value Ref Range   Glucose-Capillary 140 (H) 70 - 99 mg/dL    Comment: Glucose reference range applies only to samples taken after fasting for at least 8 hours.   No results found.  Review of Systems no recent f/c/n/v/wt loss  Blood pressure 134/81, pulse 66, temperature 98.2 F (36.8 C), temperature source Temporal, resp. rate 18, height 6' (1.829 m), weight 77.7 kg, SpO2 99 %. Physical Exam  Wn wd male in nad.  A and O  x4.  Normal mood an daffect.  EOMI.  Resp unlabored.  R ankle with health skin and plapable pulses.  Intact sens to LT at the superficial and deep peroneal nerves.  Heelcord is tight. Assessment/Plan R ankle arthritis and tight heelcord.  To the OR today for right ankle replacement and possible heelcord lengthening.  The risks and benefits of the alternative treatment options have been discussed in detail.  The patient wishes to proceed with surgery and specifically understands risks  of bleeding, infection, nerve damage, blood clots, need for additional surgery, amputation and death.   Toni Arthurs, MD 10-03-22, 7:19 AM

## 2022-09-06 NOTE — Discharge Instructions (Addendum)
Post Anesthesia Home Care Instructions  Activity: Get plenty of rest for the remainder of the day. A responsible individual must stay with you for 24 hours following the procedure.  For the next 24 hours, DO NOT: -Drive a car -Advertising copywriter -Drink alcoholic beverages -Take any medication unless instructed by your physician -Make any legal decisions or sign important papers.  Meals: Start with liquid foods such as gelatin or soup. Progress to regular foods as tolerated. Avoid greasy, spicy, heavy foods. If nausea and/or vomiting occur, drink only clear liquids until the nausea and/or vomiting subsides. Call your physician if vomiting continues.  Special Instructions/Symptoms: Your throat may feel dry or sore from the anesthesia or the breathing tube placed in your throat during surgery. If this causes discomfort, gargle with warm salt water. The discomfort should disappear within 24 hours.  If you had a scopolamine patch placed behind your ear for the management of post- operative nausea and/or vomiting:  1. The medication in the patch is effective for 72 hours, after which it should be removed.  Wrap patch in a tissue and discard in the trash. Wash hands thoroughly with soap and water. 2. You may remove the patch earlier than 72 hours if you experience unpleasant side effects which may include dry mouth, dizziness or visual disturbances. 3. Avoid touching the patch. Wash your hands with soap and water after contact with the patch.       Regional Anesthesia Blocks  1. Numbness or the inability to move the "blocked" extremity may last from 3-48 hours after placement. The length of time depends on the medication injected and your individual response to the medication. If the numbness is not going away after 48 hours, call your surgeon.  2. The extremity that is blocked will need to be protected until the numbness is gone and the  Strength has returned. Because you cannot feel it, you  will need to take extra care to avoid injury. Because it may be weak, you may have difficulty moving it or using it. You may not know what position it is in without looking at it while the block is in effect.  3. For blocks in the legs and feet, returning to weight bearing and walking needs to be done carefully. You will need to wait until the numbness is entirely gone and the strength has returned. You should be able to move your leg and foot normally before you try and bear weight or walk. You will need someone to be with you when you first try to ensure you do not fall and possibly risk injury.  4. Bruising and tenderness at the needle site are common side effects and will resolve in a few days.  5. Persistent numbness or new problems with movement should be communicated to the surgeon or the [facility] [phone] [facility] [phone]).    Toni Arthurs, MD [facility]  Please read the following information regarding your care after surgery.  Medications  You only need a prescription for the narcotic pain medicine (ex. oxycodone, Percocet, Norco).  All of the other medicines listed below are available over the counter. ? Aleve 2 pills twice a day for the first 3 days after surgery. ? acetominophen (Tylenol) 650 mg every 4-6 hours as you need for minor to moderate pain ? oxycodone as prescribed for severe pain  Narcotic pain medicine (ex. oxycodone, Percocet, Vicodin) will cause constipation.  To prevent this problem, take the following medicines while  you are taking any pain medicine. ? docusate sodium (Colace) 100 mg twice a day ? senna (Senokot) 2 tablets twice a day  ? To help prevent blood clots, take Xarelto as prescribed for two weeks after surgery.  You should also get up every hour while you are awake to move around.    Weight Bearing ? Do not bear any weight on the operated leg or foot.  Cast / Splint / Dressing ? Keep your splint, cast or  dressing clean and dry.  Don't put anything (coat hanger, pencil, etc) down inside of it.  If it gets damp, use a hair dryer on the cool setting to dry it.  If it gets soaked, call the office to schedule an appointment for a cast change.   After your dressing, cast or splint is removed; you may shower, but do not soak or scrub the wound.  Allow the water to run over it, and then gently pat it dry.  Swelling It is normal for you to have swelling where you had surgery.  To reduce swelling and pain, keep your toes above your nose for at least 3 days after surgery.  It may be necessary to keep your foot or leg elevated for several weeks.  If it hurts, it should be elevated.  Follow Up Call my office at [phone] when you are discharged from the hospital or surgery center to schedule an appointment to be seen 3 weeks after surgery.  Call my office at [phone] if you develop a fever >101.5 F, nausea, vomiting, bleeding from the surgical site or severe pain.

## 2022-09-06 NOTE — Anesthesia Procedure Notes (Signed)
Procedure Name: LMA Insertion Date/Time: 09/06/2022 9:37 AM  Performed by: Jumaane Weatherford, Jewel Baize, CRNAPre-anesthesia Checklist: Patient identified, Emergency Drugs available, Suction available and Patient being monitored Patient Re-evaluated:Patient Re-evaluated prior to induction Oxygen Delivery Method: Circle system utilized Preoxygenation: Pre-oxygenation with 100% oxygen Induction Type: IV induction Ventilation: Mask ventilation without difficulty LMA: LMA inserted LMA Size: 4.0 Number of attempts: 1 Airway Equipment and Method: Bite block Placement Confirmation: positive ETCO2 Tube secured with: Tape Dental Injury: Teeth and Oropharynx as per pre-operative assessment

## 2022-09-06 NOTE — Anesthesia Postprocedure Evaluation (Signed)
Anesthesia Post Note  Patient: Juan Morales  Procedure(s) Performed: RIGHT TOTAL ANKLE ARTHROPLASTY (Right: Ankle) ACHILLES TENDON LENGTHENING (Right: Ankle) LATERAL LIGAMENT RECONSTRUCTION (Ankle)     Patient location during evaluation: PACU Anesthesia Type: General and Regional Level of consciousness: awake and alert Pain management: pain level controlled Vital Signs Assessment: post-procedure vital signs reviewed and stable Respiratory status: spontaneous breathing, nonlabored ventilation, respiratory function stable and patient connected to nasal cannula oxygen Cardiovascular status: blood pressure returned to baseline and stable Postop Assessment: no apparent nausea or vomiting Anesthetic complications: no   No notable events documented.  Last Vitals:  Vitals:   09/06/22 1200 09/06/22 1215  BP: 134/76 131/86  Pulse: 69 76  Resp: 11 16  Temp:    SpO2: 99% 100%    Last Pain:  Vitals:   09/06/22 1215  TempSrc:   PainSc: 0-No pain        RLE Motor Response: Purposeful movement (09/06/22 1215) RLE Sensation: Numbness (09/06/22 1215)      Rashun Grattan S

## 2022-09-07 ENCOUNTER — Encounter (HOSPITAL_BASED_OUTPATIENT_CLINIC_OR_DEPARTMENT_OTHER): Payer: Self-pay | Admitting: Orthopedic Surgery

## 2022-09-16 ENCOUNTER — Other Ambulatory Visit: Payer: Self-pay | Admitting: Endocrinology

## 2022-09-16 DIAGNOSIS — E7800 Pure hypercholesterolemia, unspecified: Secondary | ICD-10-CM

## 2022-09-16 DIAGNOSIS — E1165 Type 2 diabetes mellitus with hyperglycemia: Secondary | ICD-10-CM

## 2022-09-28 DIAGNOSIS — M19171 Post-traumatic osteoarthritis, right ankle and foot: Secondary | ICD-10-CM | POA: Diagnosis not present

## 2022-09-28 DIAGNOSIS — Z4789 Encounter for other orthopedic aftercare: Secondary | ICD-10-CM | POA: Diagnosis not present

## 2022-10-01 ENCOUNTER — Encounter: Payer: Self-pay | Admitting: Endocrinology

## 2022-10-01 DIAGNOSIS — E1165 Type 2 diabetes mellitus with hyperglycemia: Secondary | ICD-10-CM

## 2022-10-01 MED ORDER — TIRZEPATIDE 5 MG/0.5ML ~~LOC~~ SOAJ
5.0000 mg | SUBCUTANEOUS | 1 refills | Status: DC
Start: 2022-10-01 — End: 2022-11-06

## 2022-10-01 MED ORDER — GLUCOSE BLOOD VI STRP
ORAL_STRIP | 12 refills | Status: AC
Start: 2022-10-01 — End: ?

## 2022-10-15 DIAGNOSIS — M19071 Primary osteoarthritis, right ankle and foot: Secondary | ICD-10-CM | POA: Diagnosis not present

## 2022-10-15 DIAGNOSIS — Z4789 Encounter for other orthopedic aftercare: Secondary | ICD-10-CM | POA: Diagnosis not present

## 2022-10-28 ENCOUNTER — Other Ambulatory Visit: Payer: Self-pay | Admitting: Endocrinology

## 2022-10-28 DIAGNOSIS — E1165 Type 2 diabetes mellitus with hyperglycemia: Secondary | ICD-10-CM

## 2022-10-30 DIAGNOSIS — M25671 Stiffness of right ankle, not elsewhere classified: Secondary | ICD-10-CM | POA: Diagnosis not present

## 2022-10-30 DIAGNOSIS — M25571 Pain in right ankle and joints of right foot: Secondary | ICD-10-CM | POA: Diagnosis not present

## 2022-11-05 ENCOUNTER — Other Ambulatory Visit: Payer: Medicare PPO

## 2022-11-05 DIAGNOSIS — E7800 Pure hypercholesterolemia, unspecified: Secondary | ICD-10-CM

## 2022-11-05 DIAGNOSIS — E1165 Type 2 diabetes mellitus with hyperglycemia: Secondary | ICD-10-CM

## 2022-11-05 LAB — HEMOGLOBIN A1C: Hgb A1c MFr Bld: 6.7 % — ABNORMAL HIGH (ref 4.6–6.5)

## 2022-11-05 LAB — BASIC METABOLIC PANEL
BUN: 31 mg/dL — ABNORMAL HIGH (ref 6–23)
CO2: 25 mEq/L (ref 19–32)
Calcium: 9.8 mg/dL (ref 8.4–10.5)
Chloride: 101 mEq/L (ref 96–112)
Creatinine, Ser: 1.09 mg/dL (ref 0.40–1.50)
GFR: 65.5 mL/min (ref >60.00)
Glucose, Bld: 142 mg/dL — ABNORMAL HIGH (ref 70–99)
Potassium: 3.8 mEq/L (ref 3.5–5.1)
Sodium: 136 mEq/L (ref 135–145)

## 2022-11-05 LAB — LDL CHOLESTEROL, DIRECT: Direct LDL: 86 mg/dL

## 2022-11-06 ENCOUNTER — Encounter: Payer: Self-pay | Admitting: Endocrinology

## 2022-11-06 ENCOUNTER — Ambulatory Visit: Payer: Medicare PPO | Admitting: Endocrinology

## 2022-11-06 VITALS — BP 120/82 | HR 86 | Ht 72.0 in | Wt 166.0 lb

## 2022-11-06 DIAGNOSIS — E1165 Type 2 diabetes mellitus with hyperglycemia: Secondary | ICD-10-CM | POA: Diagnosis not present

## 2022-11-06 DIAGNOSIS — E7800 Pure hypercholesterolemia, unspecified: Secondary | ICD-10-CM

## 2022-11-06 MED ORDER — SILDENAFIL CITRATE 100 MG PO TABS: 100.0000 mg | ORAL_TABLET | Freq: Every day | ORAL | 0 refills | Status: DC | PRN

## 2022-11-06 MED ORDER — TIRZEPATIDE 5 MG/0.5ML ~~LOC~~ SOAJ: 5.0000 mg | SUBCUTANEOUS | 1 refills | Status: DC

## 2022-11-06 NOTE — Progress Notes (Signed)
Patient ID: Juan Morales, male   DOB: [birth year], 77 y.o.   MRN: [id number]   Reason for Appointment:  follow-up for various problems  History of Present Illness      Type 2 DIABETES MELITUS  He was initially diagnosed with prediabetes in 2003. Previously had relatively mild diabetes which was previously well-controlled with low-dose Jentadueto or Janumet XR His A1c had been usually under 7%   When his A1c had gone up to 7.6% in 7/15 he was started on Trulicity  0.75 mg weekly.   In 8/18 because of relatively high A1c and fasting sugars he was started on Amaryl 1 mg at dinnertime  RECENT HISTORY:   Current medication regimen: Mounjaro 5 mg weekly, Amaryl 1 mg at dinner and Metformin ER 1500 mg daily  Current blood sugar patterns, daily management and problems identified:  His A1c is much better at 6.7  He has been on Mounjaro 5 mg instead of 3 mg Trulicity since 5/24 He has lost weight but also had ankle surgery Generally keeping his portion relatively small and he thinks that he is cutting back on sweets or having snacks of Greek yogurt now Has tried to do a little walking but not much because of not completely recovering from his ankle surgery Blood sugars at home are looking fairly good and only once had a reading of 180 No hypoglycemia with Amaryl Taking Amaryl and metformin also   Side effects from medications:  none  Monitors blood glucose: Once a day or less.    Glucometer: Accucheck   guide       Blood Glucose readings by review of monitor:  RANGE 122-1 45 with 30-day average 127 and very few readings  Previously:  PRE-MEAL Fasting Lunch Dinner Bedtime Overall  Glucose range: 109-165 136, 175     Mean/median:     141   POST-MEAL PC Breakfast PC Lunch PC Dinner  Glucose range:   152  Mean/median:       Previous: 131 AVERAGE and range 107-158    Wt Readings from Last 3 Encounters:  11/06/22 166 lb (75.3 kg)  09/06/22 171 lb 4.4 oz (77.7  kg)  08/06/22 171 lb (77.6 kg)   Lab Results  Component Value Date   HGBA1C 6.7 (H) 11/05/2022   HGBA1C 7.6 (H) 07/31/2022   HGBA1C 7.6 (H) 04/26/2022   Lab Results  Component Value Date   MICROALBUR <0.7 07/31/2022   LDLCALC 114 (H) 07/31/2022   CREATININE 1.09 11/05/2022    OTHER active problems: See review of systems  Lab on 11/05/2022  Component Date Value Ref Range Status   Direct LDL 11/05/2022 86.0  mg/dL Final   Optimal:  <409 mg/dLNear or Above Optimal:  100-129 mg/dLBorderline High:  130-159 mg/dLHigh:  160-189 mg/dLVery High:  >190 mg/dL   Sodium 81/19/1478 295  135 - 145 mEq/L Final   Potassium 11/05/2022 3.8  3.5 - 5.1 mEq/L Final   Chloride 11/05/2022 101  96 - 112 mEq/L Final   CO2 11/05/2022 25  19 - 32 mEq/L Final   Glucose, Bld 11/05/2022 142 (H)  70 - 99 mg/dL Final   BUN 62/13/0865 31 (H)  6 - 23 mg/dL Final   Creatinine, Ser 11/05/2022 1.09  0.40 - 1.50 mg/dL Final   GFR 78/46/9629 65.50  >60.00 mL/min Final   Calculated using the CKD-EPI Creatinine Equation (2021)   Calcium 11/05/2022 9.8  8.4 - 10.5 mg/dL Final   Hgb B2W MFr Bld  11/05/2022 6.7 (H)  4.6 - 6.5 % Final   Glycemic Control Guidelines for People with Diabetes:Non Diabetic:  <6%Goal of Therapy: <7%Additional Action Suggested:  >8%     Allergies as of 11/06/2022   No Known Allergies      Medication List        Accurate as of November 06, 2022  9:45 AM. If you have any questions, ask your nurse or doctor.          amoxicillin 500 MG capsule Commonly known as: AMOXIL Take 500 mg by mouth 3 (three) times daily.   docusate sodium 100 MG capsule Commonly known as: Colace Take 1 capsule (100 mg total) by mouth 2 (two) times daily. While taking narcotic pain medicine.   glimepiride 1 MG tablet Commonly known as: AMARYL TAKE 1 TABLET EVERY DAY BEFORE SUPPER.   glucose blood test strip Commonly known as: Accu-Chek Guide Use accu chek guide test strips as instructed to check blood  sugar three times daily.   glucose blood test strip Use as instructed   metFORMIN 750 MG 24 hr tablet Commonly known as: GLUCOPHAGE-XR TAKE 1 TABLET TWICE DAILY   multivitamin with minerals tablet Take 1 tablet by mouth daily.   PreserVision AREDS Tabs See admin instructions.   pravastatin 40 MG tablet Commonly known as: PRAVACHOL TAKE 1 TABLET EVERY DAY   rivaroxaban 10 MG Tabs tablet Commonly known as: Xarelto Take 1 tablet (10 mg total) by mouth daily.   senna 8.6 MG Tabs tablet Commonly known as: SENOKOT Take 2 tablets (17.2 mg total) by mouth 2 (two) times daily.   sildenafil 100 MG tablet Commonly known as: Viagra Take 1 tablet (100 mg total) by mouth daily as needed for erectile dysfunction.   tirzepatide 5 MG/0.5ML Pen Commonly known as: MOUNJARO Inject 5 mg into the skin once a week.        Allergies: No Known Allergies  Past Medical History:  Diagnosis Date   Arthritis    Diabetes mellitus without complication (HCC)    Erectile dysfunction    Hyperlipidemia    Macular degeneration     Past Surgical History:  Procedure Laterality Date   ACHILLES TENDON LENGTHENING Right 09/06/2022   Procedure: ACHILLES TENDON LENGTHENING;  Surgeon: Toni Arthurs, MD;  Location: [facility];  Service: Orthopedics;  Laterality: Right;   ANKLE RECONSTRUCTION  09/06/2022   Procedure: LATERAL LIGAMENT RECONSTRUCTION;  Surgeon: Toni Arthurs, MD;  Location: [facility];  Service: Orthopedics;;   arthroscopic knee Left    times 2   EYE SURGERY Bilateral    cataract removal   PARTIAL KNEE ARTHROPLASTY Left 07/26/2015   Procedure: LEFT UNICOMPARTMENTAL KNEE ARTHROPLASTY;  Surgeon: Kathryne Hitch, MD;  Location: [facility] OR;  Service: Orthopedics;  Laterality: Left;   TOTAL ANKLE ARTHROPLASTY Right 09/06/2022   Procedure: RIGHT TOTAL ANKLE ARTHROPLASTY;  Surgeon: Toni Arthurs, MD;  Location: [facility];  Service: Orthopedics;   Laterality: Right;    Family History  Problem Relation Age of Onset   Diabetes Neg Hx    Heart disease Neg Hx    Stroke Neg Hx    Hypertension Father     Social History:  reports that he has never smoked. He has never used smokeless tobacco. He reports that he does not drink alcohol and does not use drugs.  Review of Systems:  History of high blood pressure:  His blood pressure was unusually high in 11/21 He was then given  valsartan in 12/21 Prior to starting valsartan blood pressure at home ranged between about 125/78 up to 145/88 However prior to his visit in 6/22 he was occasionally feeling lightheaded and his systolic blood pressure was below 100 systolic Has not been on treatment since then  The blood pressure at home has been near normal now  BP Readings from Last 3 Encounters:  11/06/22 120/82  09/06/22 138/80  08/06/22 132/80     Has macular degeneration, has had regular eye exams   HYPERLIPIDEMIA: The lipid abnormality consists of elevated LDL .  Has been treated with pravastatin, 40 mg, appears to be taking this regularly  LDL is much better at 86 likely from improved diet   Lab Results  Component Value Date   CHOL 190 07/31/2022   CHOL 196 04/26/2022   CHOL 160 08/22/2021   Lab Results  Component Value Date   HDL 65.40 07/31/2022   HDL 61.50 04/26/2022   HDL 56.60 08/22/2021   Lab Results  Component Value Date   LDLCALC 114 (H) 07/31/2022   LDLCALC 115 (H) 04/26/2022   LDLCALC 87 08/22/2021   Lab Results  Component Value Date   TRIG 55.0 07/31/2022   TRIG 99.0 04/26/2022   TRIG 81.0 08/22/2021   Lab Results  Component Value Date   CHOLHDL 3 07/31/2022   CHOLHDL 3 04/26/2022   CHOLHDL 3 08/22/2021   Lab Results  Component Value Date   LDLDIRECT 86.0 11/05/2022   LDLDIRECT 144.3 02/16/2014   LDLDIRECT 151.2 02/23/2013    NEUROPATHY: He has minimal mild numbness on the right fourth and fifth toes, previously had more significant  symptoms  He has had long-standing erectile dysfunction,  has been prescribed Viagra with good results He gets his prescription filled from [place], new prescription given today     Examination:   BP 120/82 (BP Location: Left Arm, Patient Position: Sitting, Cuff Size: Small)   Pulse 86   Ht 6' (1.829 m)   Wt 166 lb (75.3 kg)   SpO2 94%   BMI 22.51 kg/m   Body mass index is 22.51 kg/m.     ASSESSMENT/ PLAN:    DIABETES type II:  He is on a regimen of metformin, Amaryl and Mounjaro 5 mg weekly  Previously lowest level has been 6.8 and A1c is now 6.7  He has improved his control with switching to [facility] but also significantly improving his diet especially after his ankle surgery His day-to-day lifestyle changes were discussed and blood sugars reviewed Encouraged him to check more readings after meals higher For now we will continue his Mounjaro unchanged as weight loss is not desirable at this point  Blood pressure is controlled without medications currently  LIPIDS: LDL is below 100 with improved diet and he will stay on the same dose of pravastatin    There are no Patient Instructions on file for this visit.      Reather Littler 11/06/2022, 9:45 AM

## 2022-11-27 DIAGNOSIS — M25571 Pain in right ankle and joints of right foot: Secondary | ICD-10-CM | POA: Diagnosis not present

## 2022-11-27 DIAGNOSIS — M25671 Stiffness of right ankle, not elsewhere classified: Secondary | ICD-10-CM | POA: Diagnosis not present

## 2022-11-30 DIAGNOSIS — M25571 Pain in right ankle and joints of right foot: Secondary | ICD-10-CM | POA: Diagnosis not present

## 2022-12-04 DIAGNOSIS — M25571 Pain in right ankle and joints of right foot: Secondary | ICD-10-CM | POA: Diagnosis not present

## 2022-12-07 DIAGNOSIS — M79671 Pain in right foot: Secondary | ICD-10-CM | POA: Diagnosis not present

## 2022-12-11 DIAGNOSIS — M25671 Stiffness of right ankle, not elsewhere classified: Secondary | ICD-10-CM | POA: Diagnosis not present

## 2022-12-11 DIAGNOSIS — M25571 Pain in right ankle and joints of right foot: Secondary | ICD-10-CM | POA: Diagnosis not present

## 2023-01-24 ENCOUNTER — Encounter: Payer: Self-pay | Admitting: Endocrinology

## 2023-01-24 ENCOUNTER — Other Ambulatory Visit: Payer: Self-pay

## 2023-01-24 DIAGNOSIS — E1165 Type 2 diabetes mellitus with hyperglycemia: Secondary | ICD-10-CM

## 2023-01-24 DIAGNOSIS — E7800 Pure hypercholesterolemia, unspecified: Secondary | ICD-10-CM

## 2023-01-24 MED ORDER — PRAVASTATIN SODIUM 40 MG PO TABS
40.0000 mg | ORAL_TABLET | Freq: Every day | ORAL | 0 refills | Status: DC
Start: 2023-01-24 — End: 2023-04-08

## 2023-01-24 MED ORDER — GLIMEPIRIDE 1 MG PO TABS
ORAL_TABLET | ORAL | 1 refills | Status: DC
Start: 2023-01-24 — End: 2023-06-20

## 2023-01-24 MED ORDER — METFORMIN HCL ER 750 MG PO TB24
750.0000 mg | ORAL_TABLET | Freq: Two times a day (BID) | ORAL | 0 refills | Status: DC
Start: 2023-01-24 — End: 2023-03-07

## 2023-01-24 MED ORDER — TIRZEPATIDE 5 MG/0.5ML ~~LOC~~ SOAJ
5.0000 mg | SUBCUTANEOUS | 1 refills | Status: DC
Start: 2023-01-24 — End: 2023-07-15

## 2023-01-24 NOTE — Telephone Encounter (Signed)
Multiple RX refill request complete, patient made aware via MyChart

## 2023-02-18 ENCOUNTER — Other Ambulatory Visit: Payer: Self-pay

## 2023-02-18 ENCOUNTER — Other Ambulatory Visit: Payer: Medicare PPO

## 2023-02-18 DIAGNOSIS — E7800 Pure hypercholesterolemia, unspecified: Secondary | ICD-10-CM

## 2023-02-18 DIAGNOSIS — E1165 Type 2 diabetes mellitus with hyperglycemia: Secondary | ICD-10-CM

## 2023-02-19 ENCOUNTER — Other Ambulatory Visit: Payer: Medicare PPO

## 2023-02-19 DIAGNOSIS — E1165 Type 2 diabetes mellitus with hyperglycemia: Secondary | ICD-10-CM | POA: Diagnosis not present

## 2023-02-19 DIAGNOSIS — E7800 Pure hypercholesterolemia, unspecified: Secondary | ICD-10-CM | POA: Diagnosis not present

## 2023-02-20 LAB — HEMOGLOBIN A1C
Hgb A1c MFr Bld: 6.5 %{Hb} — ABNORMAL HIGH (ref <5.7)
Mean Plasma Glucose: 140 mg/dL
eAG (mmol/L): 7.7 mmol/L

## 2023-02-20 LAB — MICROALBUMIN / CREATININE URINE RATIO
Creatinine, Urine: 62 mg/dL (ref 20–320)
Microalb Creat Ratio: 3 mg/g{creat} (ref <30)
Microalb, Ur: 0.2 mg/dL

## 2023-02-20 LAB — LIPID PANEL
Cholesterol: 163 mg/dL (ref <200)
HDL: 56 mg/dL (ref >=40)
LDL Cholesterol (Calc): 87 mg/dL
Non-HDL Cholesterol (Calc): 107 mg/dL (ref <130)
Total CHOL/HDL Ratio: 2.9 (calc) (ref <5.0)
Triglycerides: 107 mg/dL (ref <150)

## 2023-02-20 LAB — BASIC METABOLIC PANEL
BUN/Creatinine Ratio: 29 (calc) — ABNORMAL HIGH (ref 6–22)
BUN: 35 mg/dL — ABNORMAL HIGH (ref 7–25)
CO2: 29 mmol/L (ref 20–32)
Calcium: 9.9 mg/dL (ref 8.6–10.3)
Chloride: 102 mmol/L (ref 98–110)
Creat: 1.19 mg/dL (ref 0.70–1.28)
Glucose, Bld: 128 mg/dL — ABNORMAL HIGH (ref 65–99)
Potassium: 4.3 mmol/L (ref 3.5–5.3)
Sodium: 140 mmol/L (ref 135–146)

## 2023-02-26 ENCOUNTER — Ambulatory Visit: Payer: Medicare PPO | Admitting: Endocrinology

## 2023-02-26 ENCOUNTER — Encounter: Payer: Self-pay | Admitting: Endocrinology

## 2023-02-26 VITALS — BP 132/82 | HR 83 | Resp 20 | Ht 72.0 in | Wt 162.8 lb

## 2023-02-26 DIAGNOSIS — E119 Type 2 diabetes mellitus without complications: Secondary | ICD-10-CM

## 2023-02-26 DIAGNOSIS — Z7984 Long term (current) use of oral hypoglycemic drugs: Secondary | ICD-10-CM

## 2023-02-26 DIAGNOSIS — Z7985 Long-term (current) use of injectable non-insulin antidiabetic drugs: Secondary | ICD-10-CM

## 2023-02-26 DIAGNOSIS — E1165 Type 2 diabetes mellitus with hyperglycemia: Secondary | ICD-10-CM

## 2023-02-26 NOTE — Progress Notes (Unsigned)
Outpatient Endocrinology Note [place] Hertha Gergen, MD  02/27/23  Patient's Name: Juan Morales    DOB: [birth year]    MRN: [id number]                                                    REASON OF VISIT: Follow up for type 2 diabetes mellitus  PCP: Tally Joe, MD  HISTORY OF PRESENT ILLNESS:   Juan Morales is a 77 y.o. old male with past medical history listed below, is here for follow up for type 2 diabetes mellitus.   Pertinent Diabetes History: Patient was diagnosed with prediabetes in 2003. Previously had relatively mild diabetes which was previously well-controlled with low-dose Jentadueto or Janumet XR. His A1c had been usually under 7%. When his A1c had gone up to 7.6% in 7/15 he was started on Trulicity  0.75 mg weekly.  In 2018 Amaryl was started.  Chronic Diabetes Complications : Retinopathy: no. Last ophthalmology exam was done on annually, following with ophthalmology regularly.  Nephropathy: no Peripheral neuropathy: no Coronary artery disease: no Stroke: no  Relevant comorbidities and cardiovascular risk factors: Obesity: on Body mass index is 22.08 kg/m.  Hypertension: no Hyperlipidemia : Yes, on statin   Current / Home Diabetic regimen includes:  Mounjaro 5 mg weekly. Amaryl 1 mg at dinner. Metformin ER 750 mg two times a day.   Prior diabetic medications: Trulicity in the past changed to [facility] in May 2024.  Janumet in the past.  Glycemic data:    He has Accu-Chek glucometer checking daily in the morning fasting, lowest blood sugar 93, highest blood sugar 127.  Average blood sugar in last 2 weeks November 19 to December 3, 111.  No significant hypoglycemia.  No hypoglycemia.   Hypoglycemia: Patient has no hypoglycemic episodes. Patient has hypoglycemia awareness.  Factors modifying glucose control: 1.  Diabetic diet assessment: 3 meals a day.  2.  Staying active or exercising: Walking/exercise started.  3.  Medication compliance: compliant all of the  time.  Interval history  Patient has been taking Mounjaro 5 mg weekly, he lost about 10 pounds of weight in last 3 months.  He is concerned about continued weight loss.  He also mentioned that he has made significant improvement on the diet, eating healthy and also has increased physical activity.  He would like to stop Mounjaro and watch without it. He would like to rather gain some weight. Recent lab results reviewed.  Acceptable cholesterol with LDL 87.  Normal electrolytes and stable creatinine.  Hemoglobin A1c 6.5%.  REVIEW OF SYSTEMS As per history of present illness.   PAST MEDICAL HISTORY: Past Medical History:  Diagnosis Date   Arthritis    Diabetes mellitus without complication (HCC)    Erectile dysfunction    Hyperlipidemia    Macular degeneration     PAST SURGICAL HISTORY: Past Surgical History:  Procedure Laterality Date   ACHILLES TENDON LENGTHENING Right 09/06/2022   Procedure: ACHILLES TENDON LENGTHENING;  Surgeon: Toni Arthurs, MD;  Location: [facility];  Service: Orthopedics;  Laterality: Right;   ANKLE RECONSTRUCTION  09/06/2022   Procedure: LATERAL LIGAMENT RECONSTRUCTION;  Surgeon: Toni Arthurs, MD;  Location: [facility];  Service: Orthopedics;;   arthroscopic knee Left    times 2   EYE SURGERY Bilateral    cataract removal  PARTIAL KNEE ARTHROPLASTY Left 07/26/2015   Procedure: LEFT UNICOMPARTMENTAL KNEE ARTHROPLASTY;  Surgeon: Kathryne Hitch, MD;  Location: [facility] OR;  Service: Orthopedics;  Laterality: Left;   TOTAL ANKLE ARTHROPLASTY Right 09/06/2022   Procedure: RIGHT TOTAL ANKLE ARTHROPLASTY;  Surgeon: Toni Arthurs, MD;  Location: [facility];  Service: Orthopedics;  Laterality: Right;    ALLERGIES: No Known Allergies  FAMILY HISTORY:  Family History  Problem Relation Age of Onset   Diabetes Neg Hx    Heart disease Neg Hx    Stroke Neg Hx    Hypertension Father     SOCIAL HISTORY: Social History    Socioeconomic History   Marital status: Married    Spouse name: Not on file   Number of children: Not on file   Years of education: Not on file   Highest education level: Not on file  Occupational History   Not on file  Tobacco Use   Smoking status: Never   Smokeless tobacco: Never  Substance and Sexual Activity   Alcohol use: No   Drug use: No   Sexual activity: Not on file  Other Topics Concern   Not on file  Social History Narrative   Not on file   Social Determinants of Health   Financial Resource Strain: Not on file  Food Insecurity: Not on file  Transportation Needs: Not on file  Physical Activity: Not on file  Stress: Not on file  Social Connections: Not on file    MEDICATIONS:  Current Outpatient Medications  Medication Sig Dispense Refill   docusate sodium (COLACE) 100 MG capsule Take 1 capsule (100 mg total) by mouth 2 (two) times daily. While taking narcotic pain medicine. 30 capsule 0   glimepiride (AMARYL) 1 MG tablet TAKE 1 TABLET EVERY DAY BEFORE SUPPER. 90 tablet 1   glucose blood (ACCU-CHEK GUIDE) test strip Use accu chek guide test strips as instructed to check blood sugar three times daily. 300 each 3   glucose blood test strip Use as instructed 100 each 12   metFORMIN (GLUCOPHAGE-XR) 750 MG 24 hr tablet Take 1 tablet (750 mg total) by mouth 2 (two) times daily. 90 tablet 0   Multiple Vitamins-Minerals (MULTIVITAMIN WITH MINERALS) tablet Take 1 tablet by mouth daily.     Multiple Vitamins-Minerals (PRESERVISION AREDS) TABS See admin instructions.     pravastatin (PRAVACHOL) 40 MG tablet Take 1 tablet (40 mg total) by mouth daily. 90 tablet 0   rivaroxaban (XARELTO) 10 MG TABS tablet Take 1 tablet (10 mg total) by mouth daily. 14 tablet 0   senna (SENOKOT) 8.6 MG TABS tablet Take 2 tablets (17.2 mg total) by mouth 2 (two) times daily. 30 tablet 0   sildenafil (VIAGRA) 100 MG tablet Take 1 tablet (100 mg total) by mouth daily as needed for erectile  dysfunction. 40 tablet 0   tirzepatide (MOUNJARO) 5 MG/0.5ML Pen Inject 5 mg into the skin once a week. 6 mL 1   No current facility-administered medications for this visit.    PHYSICAL EXAM: Vitals:   02/26/23 1620  BP: 132/82  Pulse: 83  Resp: 20  SpO2: 99%  Weight: 162 lb 12.8 oz (73.8 kg)  Height: 6' (1.829 m)   Body mass index is 22.08 kg/m.  Wt Readings from Last 3 Encounters:  02/26/23 162 lb 12.8 oz (73.8 kg)  11/06/22 166 lb (75.3 kg)  09/06/22 171 lb 4.4 oz (77.7 kg)    General: Well developed, well nourished male in  no apparent distress.  HEENT: AT/[place], no external lesions.  Eyes: Conjunctiva clear and no icterus. Neck: Neck supple  Lungs: Respirations not labored Neurologic: Alert, oriented, normal speech Extremities / Skin: Dry. No sores or rashes noted.  Psychiatric: Does not appear depressed or anxious  Diabetic Foot Exam - Simple   No data filed    LABS Reviewed Lab Results  Component Value Date   HGBA1C 6.5 (H) 02/19/2023   HGBA1C 6.7 (H) 11/05/2022   HGBA1C 7.6 (H) 07/31/2022   Lab Results  Component Value Date   FRUCTOSAMINE 283 (H) 10/21/2013   Lab Results  Component Value Date   CHOL 163 02/19/2023   HDL 56 02/19/2023   LDLCALC 87 02/19/2023   LDLDIRECT 86.0 11/05/2022   TRIG 107 02/19/2023   CHOLHDL 2.9 02/19/2023   Lab Results  Component Value Date   MICRALBCREAT 3 02/19/2023   MICRALBCREAT 0.8 07/31/2022   Lab Results  Component Value Date   CREATININE 1.19 02/19/2023   Lab Results  Component Value Date   GFR 65.50 11/05/2022    ASSESSMENT / PLAN  1. Controlled type 2 diabetes mellitus without complication, without long-term current use of insulin (HCC)     Diabetes Mellitus type 2, complicated by no known complications. - Diabetic status / severity: Controlled.  Lab Results  Component Value Date   HGBA1C 6.5 (H) 02/19/2023    - Hemoglobin A1c goal : <7%  - Medications: See below.  I) continue Amaryl 1 mg  daily. II) continue metformin extended release 750 mg 2 times a day. III) he will continue Mounjaro for 1 month and stop.  He is concerned about continued weight loss.  BMI 22.  It is reasonable to hold metformin and monitor.  Discussed about maintaining diet control and exercise.  - Home glucose testing: In the morning fasting daily. - Discussed/ Gave Hypoglycemia treatment plan.  # Consult : not required at this time.   # Annual urine for microalbuminuria/ creatinine ratio, no microalbuminuria currently. Last  Lab Results  Component Value Date   MICRALBCREAT 3 02/19/2023    # Foot check nightly.  # Annual dilated diabetic eye exams.   - Diet: Make healthy diabetic food choices - Life style / activity / exercise: Discussed.  2. Blood pressure  -  BP Readings from Last 1 Encounters:  02/26/23 132/82    - Control is in target.  - No change in current plans.  3. Lipid status / Hyperlipidemia - Last  Lab Results  Component Value Date   LDLCALC 87 02/19/2023   - Continue pravastatin 40 mg daily.  Diagnoses and all orders for this visit:  Controlled type 2 diabetes mellitus without complication, without long-term current use of insulin (HCC) -     Basic metabolic panel -     Hemoglobin A1c    DISPOSITION Follow up in clinic in 4  months suggested.   All questions answered and patient verbalized understanding of the plan.  [place] Tupac Jeffus, MD [facility] Endocrinology [facility] [address], [address] [place], [place] [address] Phone # [phone]  At least part of this note was generated using voice recognition software. Inadvertent word errors may have occurred, which were not recognized during the proofreading process.

## 2023-02-27 DIAGNOSIS — M19071 Primary osteoarthritis, right ankle and foot: Secondary | ICD-10-CM | POA: Diagnosis not present

## 2023-02-27 DIAGNOSIS — Z96661 Presence of right artificial ankle joint: Secondary | ICD-10-CM | POA: Diagnosis not present

## 2023-03-06 DIAGNOSIS — H31003 Unspecified chorioretinal scars, bilateral: Secondary | ICD-10-CM | POA: Diagnosis not present

## 2023-03-06 DIAGNOSIS — H43391 Other vitreous opacities, right eye: Secondary | ICD-10-CM | POA: Diagnosis not present

## 2023-03-06 DIAGNOSIS — H353121 Nonexudative age-related macular degeneration, left eye, early dry stage: Secondary | ICD-10-CM | POA: Diagnosis not present

## 2023-03-06 DIAGNOSIS — H353212 Exudative age-related macular degeneration, right eye, with inactive choroidal neovascularization: Secondary | ICD-10-CM | POA: Diagnosis not present

## 2023-03-06 DIAGNOSIS — H43813 Vitreous degeneration, bilateral: Secondary | ICD-10-CM | POA: Diagnosis not present

## 2023-03-06 DIAGNOSIS — H35373 Puckering of macula, bilateral: Secondary | ICD-10-CM | POA: Diagnosis not present

## 2023-03-07 ENCOUNTER — Other Ambulatory Visit: Payer: Self-pay | Admitting: Endocrinology

## 2023-03-07 DIAGNOSIS — E1165 Type 2 diabetes mellitus with hyperglycemia: Secondary | ICD-10-CM

## 2023-04-07 ENCOUNTER — Other Ambulatory Visit: Payer: Self-pay | Admitting: Endocrinology

## 2023-04-07 DIAGNOSIS — E7800 Pure hypercholesterolemia, unspecified: Secondary | ICD-10-CM

## 2023-04-17 ENCOUNTER — Other Ambulatory Visit: Payer: Self-pay | Admitting: Endocrinology

## 2023-04-17 DIAGNOSIS — E1165 Type 2 diabetes mellitus with hyperglycemia: Secondary | ICD-10-CM

## 2023-06-20 ENCOUNTER — Other Ambulatory Visit: Payer: Self-pay | Admitting: Endocrinology

## 2023-06-20 DIAGNOSIS — E1165 Type 2 diabetes mellitus with hyperglycemia: Secondary | ICD-10-CM

## 2023-07-01 ENCOUNTER — Other Ambulatory Visit: Payer: Self-pay

## 2023-07-11 ENCOUNTER — Other Ambulatory Visit: Payer: Medicare PPO

## 2023-07-11 DIAGNOSIS — E1165 Type 2 diabetes mellitus with hyperglycemia: Secondary | ICD-10-CM | POA: Diagnosis not present

## 2023-07-12 LAB — BASIC METABOLIC PANEL WITH GFR
BUN/Creatinine Ratio: 31 (calc) — ABNORMAL HIGH (ref 6–22)
BUN: 32 mg/dL — ABNORMAL HIGH (ref 7–25)
CO2: 29 mmol/L (ref 20–32)
Calcium: 9.9 mg/dL (ref 8.6–10.3)
Chloride: 105 mmol/L (ref 98–110)
Creat: 1.02 mg/dL (ref 0.70–1.28)
Glucose, Bld: 132 mg/dL — ABNORMAL HIGH (ref 65–99)
Potassium: 4.8 mmol/L (ref 3.5–5.3)
Sodium: 140 mmol/L (ref 135–146)
eGFR: 75 mL/min/{1.73_m2} (ref >=60)

## 2023-07-12 LAB — HEMOGLOBIN A1C
Hgb A1c MFr Bld: 6.5 % — ABNORMAL HIGH (ref <5.7)
Mean Plasma Glucose: 140 mg/dL
eAG (mmol/L): 7.7 mmol/L

## 2023-07-15 ENCOUNTER — Encounter: Payer: Self-pay | Admitting: Endocrinology

## 2023-07-15 ENCOUNTER — Ambulatory Visit: Payer: Medicare PPO | Admitting: Endocrinology

## 2023-07-15 VITALS — BP 116/60 | HR 91 | Resp 20 | Ht 72.0 in | Wt 157.0 lb

## 2023-07-15 DIAGNOSIS — Z7985 Long-term (current) use of injectable non-insulin antidiabetic drugs: Secondary | ICD-10-CM | POA: Diagnosis not present

## 2023-07-15 DIAGNOSIS — Z7984 Long term (current) use of oral hypoglycemic drugs: Secondary | ICD-10-CM

## 2023-07-15 DIAGNOSIS — E119 Type 2 diabetes mellitus without complications: Secondary | ICD-10-CM | POA: Diagnosis not present

## 2023-07-15 DIAGNOSIS — E1165 Type 2 diabetes mellitus with hyperglycemia: Secondary | ICD-10-CM

## 2023-07-15 MED ORDER — GLIMEPIRIDE 2 MG PO TABS: 2.0000 mg | ORAL_TABLET | Freq: Every day | ORAL | 3 refills | Status: DC

## 2023-07-15 NOTE — Patient Instructions (Signed)
 Increase glimepiride  to 2 mg daily.  Stop Mounjaro .  Same dose of metformin .

## 2023-07-15 NOTE — Progress Notes (Signed)
 Outpatient Endocrinology Note Juan Cionna Collantes, MD  07/15/23  Patient's Name: Juan Morales    DOB: 03-Apr-1945    MRN: [id number]                                                    REASON OF VISIT: Follow up for type 2 diabetes mellitus  PCP: Juan Bugler, MD  HISTORY OF PRESENT ILLNESS:   Juan Morales is a 78 y.o. old male with past medical history listed below, is here for follow up for type 2 diabetes mellitus.   Pertinent Diabetes History: Patient was diagnosed with prediabetes in 2003. Previously had relatively mild diabetes which was previously well-controlled with low-dose Jentadueto or Janumet  XR. His A1c had been usually under 7%. When his A1c had gone up to 7.6% in 7/15 he was started on Trulicity   0.75 mg weekly.  In 2018 Amaryl  was started.  Chronic Diabetes Complications : Retinopathy: no. Last ophthalmology exam was done on annually, following with ophthalmology regularly.  Nephropathy: no Peripheral neuropathy: no Coronary artery disease: no Stroke: no  Relevant comorbidities and cardiovascular risk factors: Obesity: on Body mass index is 21.29 kg/m.  Hypertension: no Hyperlipidemia : Yes, on statin   Current / Home Diabetic regimen includes:  Mounjaro  5 mg weekly. Amaryl  1 mg at dinner. Metformin  ER 750 mg two times a day.   Prior diabetic medications: Trulicity  in the past changed to Mounjaro  in May 2024.  Janumet  in the past.  Glycemic data:    He has Accu-Chek glucometer checking daily in the morning fasting, lowest blood sugar 85, highest blood sugar 132.  Average blood sugar in last 2 weeks April 7 2 Abrol 21, 2025 is 105.  He has been checking blood sugar in the morning fasting.  Mostly acceptable blood sugar.   Hypoglycemia: Patient has no hypoglycemic episodes. Patient has hypoglycemia awareness.  Factors modifying glucose control: 1.  Diabetic diet assessment: 3 meals a day.  2.  Staying active or exercising: Walking/exercise started.  3.   Medication compliance: compliant all of the time.  Interval history  Patient has been taking Mounjaro  5 mg weekly, he lost about 10 pounds of weight in last 3 months.  He is concerned about continued weight loss.  He also mentioned that he has made significant improvement on the diet, eating healthy and also has increased physical activity.  He would like to stop Mounjaro  and watch without it. He would like to rather gain some weight. Recent lab results reviewed.  Acceptable cholesterol with LDL 87.  Normal electrolytes and stable creatinine.  Hemoglobin A1c 6.5%.  REVIEW OF SYSTEMS As per history of present illness.   PAST MEDICAL HISTORY: Past Medical History:  Diagnosis Date   Arthritis    Diabetes mellitus without complication (HCC)    Erectile dysfunction    Hyperlipidemia    Macular degeneration     PAST SURGICAL HISTORY: Past Surgical History:  Procedure Laterality Date   ACHILLES TENDON LENGTHENING Right 09/06/2022   Procedure: ACHILLES TENDON LENGTHENING;  Surgeon: Amada Backer, MD;  Location: [facility];  Service: Orthopedics;  Laterality: Right;   ANKLE RECONSTRUCTION  09/06/2022   Procedure: LATERAL LIGAMENT RECONSTRUCTION;  Surgeon: Amada Backer, MD;  Location: [facility];  Service: Orthopedics;;   arthroscopic knee Left    times 2  EYE SURGERY Bilateral    cataract removal   PARTIAL KNEE ARTHROPLASTY Left 07/26/2015   Procedure: LEFT UNICOMPARTMENTAL KNEE ARTHROPLASTY;  Surgeon: Arnie Lao, MD;  Location: [facility];  Service: Orthopedics;  Laterality: Left;   TOTAL ANKLE ARTHROPLASTY Right 09/06/2022   Procedure: RIGHT TOTAL ANKLE ARTHROPLASTY;  Surgeon: Amada Backer, MD;  Location: [facility];  Service: Orthopedics;  Laterality: Right;    ALLERGIES: No Known Allergies  FAMILY HISTORY:  Family History  Problem Relation Age of Onset   Diabetes Neg Hx    Heart disease Neg Hx    Stroke Neg Hx    Hypertension  Father     SOCIAL HISTORY: Social History   Socioeconomic History   Marital status: Married    Spouse name: Not on file   Number of children: Not on file   Years of education: Not on file   Highest education level: Not on file  Occupational History   Not on file  Tobacco Use   Smoking status: Never   Smokeless tobacco: Never  Substance and Sexual Activity   Alcohol use: No   Drug use: No   Sexual activity: Not on file  Other Topics Concern   Not on file  Social History Narrative   Not on file   Social Drivers of Health   Financial Resource Strain: Not on file  Food Insecurity: Not on file  Transportation Needs: Not on file  Physical Activity: Not on file  Stress: Not on file  Social Connections: Not on file    MEDICATIONS:  Current Outpatient Medications  Medication Sig Dispense Refill   docusate sodium  (COLACE) 100 MG capsule Take 1 capsule (100 mg total) by mouth 2 (two) times daily. While taking narcotic pain medicine. 30 capsule 0   glimepiride  (AMARYL ) 1 MG tablet TAKE 1 TABLET EVERY DAY BEFORE SUPPER 90 tablet 3   glucose blood (ACCU-CHEK GUIDE) test strip Use accu chek guide test strips as instructed to check blood sugar three times daily. 300 each 3   glucose blood test strip Use as instructed 100 each 12   metFORMIN  (GLUCOPHAGE -XR) 750 MG 24 hr tablet TAKE 1 TABLET TWICE DAILY 180 tablet 3   Multiple Vitamins-Minerals (MULTIVITAMIN WITH MINERALS) tablet Take 1 tablet by mouth daily.     Multiple Vitamins-Minerals (PRESERVISION AREDS) TABS See admin instructions.     pravastatin  (PRAVACHOL ) 40 MG tablet TAKE 1 TABLET EVERY DAY 90 tablet 3   rivaroxaban  (XARELTO ) 10 MG TABS tablet Take 1 tablet (10 mg total) by mouth daily. 14 tablet 0   senna (SENOKOT) 8.6 MG TABS tablet Take 2 tablets (17.2 mg total) by mouth 2 (two) times daily. 30 tablet 0   sildenafil  (VIAGRA ) 100 MG tablet Take 1 tablet (100 mg total) by mouth daily as needed for erectile dysfunction. 40  tablet 0   tirzepatide  (MOUNJARO ) 5 MG/0.5ML Pen Inject 5 mg into the skin once a week. 6 mL 1   No current facility-administered medications for this visit.    PHYSICAL EXAM: Vitals:   07/15/23 1606  BP: 116/60  Pulse: 91  Resp: 20  SpO2: 99%  Weight: 157 lb (71.2 kg)  Height: 6' (1.829 m)    Body mass index is 21.29 kg/m.  Wt Readings from Last 3 Encounters:  07/15/23 157 lb (71.2 kg)  02/26/23 162 lb 12.8 oz (73.8 kg)  11/06/22 166 lb (75.3 kg)    General: Well developed, well nourished male in no apparent distress.  HEENT: AT/[place], no external lesions.  Eyes: Conjunctiva clear and no icterus. Neck: Neck supple  Lungs: Respirations not labored Neurologic: Alert, oriented, normal speech Extremities / Skin: Dry. No sores or rashes noted.  Psychiatric: Does not appear depressed or anxious  Diabetic Foot Exam - Simple   No data filed    LABS Reviewed Lab Results  Component Value Date   HGBA1C 6.5 (H) 07/11/2023   HGBA1C 6.5 (H) 02/19/2023   HGBA1C 6.7 (H) 11/05/2022   Lab Results  Component Value Date   FRUCTOSAMINE 283 (H) 10/21/2013   Lab Results  Component Value Date   CHOL 163 02/19/2023   HDL 56 02/19/2023   LDLCALC 87 02/19/2023   LDLDIRECT 86.0 11/05/2022   TRIG 107 02/19/2023   CHOLHDL 2.9 02/19/2023   Lab Results  Component Value Date   MICRALBCREAT 3 02/19/2023   MICRALBCREAT 0.8 07/31/2022   Lab Results  Component Value Date   CREATININE 1.02 07/11/2023   Lab Results  Component Value Date   GFR 65.50 11/05/2022    ASSESSMENT / PLAN  1. Controlled type 2 diabetes mellitus without complication, without long-term current use of insulin  (HCC)      Diabetes Mellitus type 2, complicated by no known complications. - Diabetic status / severity: Controlled.  Lab Results  Component Value Date   HGBA1C 6.5 (H) 07/11/2023    - Hemoglobin A1c goal : <7%  - Medications: See below.  I) continue Amaryl  1 mg daily. II) continue  metformin  extended release 750 mg 2 times a day. III) he will continue Mounjaro  for 1 month and stop.  He is concerned about continued weight loss.  BMI 22.  It is reasonable to hold metformin  and monitor.  Discussed about maintaining diet control and exercise.  - Home glucose testing: In the morning fasting daily. - Discussed/ Gave Hypoglycemia treatment plan.  # Consult : not required at this time.   # Annual urine for microalbuminuria/ creatinine ratio, no microalbuminuria currently. Last  Lab Results  Component Value Date   MICRALBCREAT 3 02/19/2023    # Foot check nightly.  # Annual dilated diabetic eye exams.   - Diet: Make healthy diabetic food choices - Life style / activity / exercise: Discussed.  2. Blood pressure  -  BP Readings from Last 1 Encounters:  07/15/23 116/60    - Control is in target.  - No change in current plans.  3. Lipid status / Hyperlipidemia - Last  Lab Results  Component Value Date   LDLCALC 87 02/19/2023   - Continue pravastatin  40 mg daily.  Diagnoses and all orders for this visit:  Controlled type 2 diabetes mellitus without complication, without long-term current use of insulin  (HCC)     DISPOSITION Follow up in clinic in 4  months suggested.   All questions answered and patient verbalized understanding of the plan.  Juan Valentina Alcoser, MD [facility] Endocrinology [facility] [address] [place], [address] [place], [place] [address] Phone # [phone]  At least part of this note was generated using voice recognition software. Inadvertent word errors may have occurred, which were not recognized during the proofreading process.

## 2023-07-25 ENCOUNTER — Other Ambulatory Visit: Payer: Self-pay

## 2023-07-25 DIAGNOSIS — E1165 Type 2 diabetes mellitus with hyperglycemia: Secondary | ICD-10-CM

## 2023-07-25 MED ORDER — GLIMEPIRIDE 2 MG PO TABS: ORAL_TABLET | ORAL | 3 refills | Status: DC

## 2023-09-10 ENCOUNTER — Encounter: Payer: Self-pay | Admitting: Dermatology

## 2023-09-10 ENCOUNTER — Ambulatory Visit: Admitting: Dermatology

## 2023-09-10 DIAGNOSIS — L578 Other skin changes due to chronic exposure to nonionizing radiation: Secondary | ICD-10-CM | POA: Diagnosis not present

## 2023-09-10 DIAGNOSIS — W908XXA Exposure to other nonionizing radiation, initial encounter: Secondary | ICD-10-CM | POA: Diagnosis not present

## 2023-09-10 DIAGNOSIS — L570 Actinic keratosis: Secondary | ICD-10-CM

## 2023-09-10 NOTE — Progress Notes (Signed)
 New Patient Visit   Subjective  Juan Morales is a 78 y.o. male who presents for the following: Pt concerned about irregular skin lesions on the face/ears and would like them checked today.   The patient has spots, moles and lesions to be evaluated, some may be new or changing and the patient may have concern these could be cancer.  The following portions of the chart were reviewed this encounter and updated as appropriate: medications, allergies, medical history  Review of Systems:  No other skin or systemic complaints except as noted in HPI or Assessment and Plan.  Objective  Well appearing patient in no apparent distress; mood and affect are within normal limits.   A focused examination was performed of the following areas: the face, scalp, arms, hands, and ears.   Relevant exam findings are noted in the Assessment and Plan.  L forearm x 1 (28), R mid helix x 1, vertex scalp x 5,  R temple x 2, R cheek x 2, R brow x 1, central forehead x 1, L temple x 1, L helical insertion x 1, L helix x 2, L preauricular x 1, L jaw line x 1, L neck x 3, L med cheek x 1, R dorsal hand x 4, L dorsal wrist x 1 Erythematous thin papules/macules with gritty scale.   Assessment & Plan   ACTINIC DAMAGE WITH PRECANCEROUS ACTINIC KERATOSES Counseling for Topical Chemotherapy Management: Patient exhibits: - Severe, confluent actinic changes with pre-cancerous actinic keratoses that is secondary to cumulative UV radiation exposure over time - Condition that is severe; chronic, not at goal. - diffuse scaly erythematous macules and papules with underlying dyspigmentation - Discussed Prescription Field Treatment topical Chemotherapy for Severe, Chronic Confluent Actinic Changes with Pre-Cancerous Actinic Keratoses Field treatment involves treatment of an entire area of skin that has confluent Actinic Changes (Sun/ Ultraviolet light damage) and PreCancerous Actinic Keratoses by method of PhotoDynamic  Therapy (PDT) and/or prescription Topical Chemotherapy agents such as 5-fluorouracil, 5-fluorouracil/calcipotriene, and/or imiquimod.  The purpose is to decrease the number of clinically evident and subclinical PreCancerous lesions to prevent progression to development of skin cancer by chemically destroying early precancer changes that may or may not be visible.  It has been shown to reduce the risk of developing skin cancer in the treated area. As a result of treatment, redness, scaling, crusting, and open sores may occur during treatment course. One or more than one of these methods may be used and may have to be used several times to control, suppress and eliminate the PreCancerous changes. Discussed treatment course, expected reaction, and possible side effects. - Recommend daily broad spectrum sunscreen SPF 30+ to sun-exposed areas, reapply every 2 hours as needed.  - Staying in the shade or wearing long sleeves, sun glasses (UVA+UVB protection) and wide brim hats (4-inch brim around the entire circumference of the hat) are also recommended. - Call for new or changing lesions.  AK (ACTINIC KERATOSIS) (29) L forearm x 1 (28), R mid helix x 1, vertex scalp x 5,  R temple x 2, R cheek x 2, R brow x 1, central forehead x 1, L temple x 1, L helical insertion x 1, L helix x 2, L preauricular x 1, L jaw line x 1, L neck x 3, L med cheek x 1, R dorsal hand x 4, L dorsal wrist x 1 Actinic keratoses are precancerous spots that appear secondary to cumulative UV radiation exposure/sun exposure over time. They are chronic  with expected duration over 1 year. A portion of actinic keratoses will progress to squamous cell carcinoma of the skin. It is not possible to reliably predict which spots will progress to skin cancer and so treatment is recommended to prevent development of skin cancer.  Recommend daily broad spectrum sunscreen SPF 30+ to sun-exposed areas, reapply every 2 hours as needed.  Recommend staying in  the shade or wearing long sleeves, sun glasses (UVA+UVB protection) and wide brim hats (4-inch brim around the entire circumference of the hat). Call for new or changing lesions.  Destruction of lesion - L forearm x 1 (28), R mid helix x 1, vertex scalp x 5,  R temple x 2, R cheek x 2, R brow x 1, central forehead x 1, L temple x 1, L helical insertion x 1, L helix x 2, L preauricular x 1, L jaw line x 1, L neck x 3, L med cheek x 1, R dorsal hand x 4, L dorsal wrist x 1 Complexity: simple   Destruction method: cryotherapy   Informed consent: discussed and consent obtained   Timeout:  patient name, date of birth, surgical site, and procedure verified Lesion destroyed using liquid nitrogen: Yes   Region frozen until ice ball extended beyond lesion: Yes   Outcome: patient tolerated procedure well with no complications   Post-procedure details: wound care instructions given   ACTINIC ELASTOSIS    Return in about 1 year (around 09/09/2024) for AK follow up.  Arlinda Lais, CMA, am acting as scribe for Harris Liming, MD .   Documentation: I have reviewed the above documentation for accuracy and completeness, and I agree with the above.  Harris Liming, MD

## 2023-09-10 NOTE — Patient Instructions (Signed)

## 2023-09-17 DIAGNOSIS — Z1331 Encounter for screening for depression: Secondary | ICD-10-CM | POA: Diagnosis not present

## 2023-09-17 DIAGNOSIS — E7800 Pure hypercholesterolemia, unspecified: Secondary | ICD-10-CM | POA: Diagnosis not present

## 2023-09-17 DIAGNOSIS — R3915 Urgency of urination: Secondary | ICD-10-CM | POA: Diagnosis not present

## 2023-09-17 DIAGNOSIS — Z1211 Encounter for screening for malignant neoplasm of colon: Secondary | ICD-10-CM | POA: Diagnosis not present

## 2023-09-17 DIAGNOSIS — I8390 Asymptomatic varicose veins of unspecified lower extremity: Secondary | ICD-10-CM | POA: Diagnosis not present

## 2023-09-17 DIAGNOSIS — E1169 Type 2 diabetes mellitus with other specified complication: Secondary | ICD-10-CM | POA: Diagnosis not present

## 2023-09-17 DIAGNOSIS — Z0000 Encounter for general adult medical examination without abnormal findings: Secondary | ICD-10-CM | POA: Diagnosis not present

## 2023-09-17 DIAGNOSIS — N529 Male erectile dysfunction, unspecified: Secondary | ICD-10-CM | POA: Diagnosis not present

## 2023-09-17 DIAGNOSIS — Z23 Encounter for immunization: Secondary | ICD-10-CM | POA: Diagnosis not present

## 2023-09-30 DIAGNOSIS — Z96661 Presence of right artificial ankle joint: Secondary | ICD-10-CM | POA: Diagnosis not present

## 2023-09-30 DIAGNOSIS — M19071 Primary osteoarthritis, right ankle and foot: Secondary | ICD-10-CM | POA: Diagnosis not present

## 2023-10-04 DIAGNOSIS — Z1211 Encounter for screening for malignant neoplasm of colon: Secondary | ICD-10-CM | POA: Diagnosis not present

## 2023-11-19 ENCOUNTER — Ambulatory Visit: Admitting: Endocrinology

## 2023-11-26 ENCOUNTER — Encounter: Payer: Self-pay | Admitting: Endocrinology

## 2023-11-26 ENCOUNTER — Other Ambulatory Visit: Payer: Self-pay | Admitting: Endocrinology

## 2023-11-26 DIAGNOSIS — R338 Other retention of urine: Secondary | ICD-10-CM | POA: Diagnosis not present

## 2023-11-26 DIAGNOSIS — E7800 Pure hypercholesterolemia, unspecified: Secondary | ICD-10-CM

## 2023-11-26 DIAGNOSIS — N5201 Erectile dysfunction due to arterial insufficiency: Secondary | ICD-10-CM | POA: Diagnosis not present

## 2023-11-26 DIAGNOSIS — R3915 Urgency of urination: Secondary | ICD-10-CM | POA: Diagnosis not present

## 2023-11-26 DIAGNOSIS — E119 Type 2 diabetes mellitus without complications: Secondary | ICD-10-CM

## 2023-11-26 NOTE — Telephone Encounter (Signed)
Order placed for the labs

## 2023-11-27 ENCOUNTER — Ambulatory Visit: Admitting: Endocrinology

## 2023-11-27 ENCOUNTER — Encounter: Payer: Self-pay | Admitting: Endocrinology

## 2023-12-31 ENCOUNTER — Encounter: Payer: Self-pay | Admitting: Endocrinology

## 2023-12-31 DIAGNOSIS — E118 Type 2 diabetes mellitus with unspecified complications: Secondary | ICD-10-CM

## 2023-12-31 DIAGNOSIS — E119 Type 2 diabetes mellitus without complications: Secondary | ICD-10-CM

## 2023-12-31 NOTE — Telephone Encounter (Signed)
 I recommend to have visit with foot doctor/podiatry.  If you do not have one we can refer.

## 2023-12-31 NOTE — Telephone Encounter (Signed)
 Sent a urgent referral.

## 2024-01-07 ENCOUNTER — Encounter: Payer: Self-pay | Admitting: Endocrinology

## 2024-01-07 NOTE — Telephone Encounter (Signed)
 Can you please involve referring team for expediting this referral?  Thanks.

## 2024-01-13 ENCOUNTER — Other Ambulatory Visit

## 2024-01-13 DIAGNOSIS — E119 Type 2 diabetes mellitus without complications: Secondary | ICD-10-CM | POA: Diagnosis not present

## 2024-01-14 ENCOUNTER — Ambulatory Visit: Payer: Self-pay | Admitting: Endocrinology

## 2024-01-14 LAB — BASIC METABOLIC PANEL WITH GFR
BUN: 22 mg/dL (ref 7–25)
CO2: 30 mmol/L (ref 20–32)
Calcium: 9.8 mg/dL (ref 8.6–10.3)
Chloride: 104 mmol/L (ref 98–110)
Creat: 0.98 mg/dL (ref 0.70–1.28)
Glucose, Bld: 240 mg/dL — ABNORMAL HIGH (ref 65–99)
Potassium: 5.4 mmol/L — ABNORMAL HIGH (ref 3.5–5.3)
Sodium: 140 mmol/L (ref 135–146)
eGFR: 79 mL/min/1.73m2 (ref >=60)

## 2024-01-14 LAB — LIPID PANEL
Cholesterol: 199 mg/dL (ref <200)
HDL: 71 mg/dL (ref >=40)
LDL Cholesterol (Calc): 107 mg/dL — ABNORMAL HIGH
Non-HDL Cholesterol (Calc): 128 mg/dL (ref <130)
Total CHOL/HDL Ratio: 2.8 (calc) (ref <5.0)
Triglycerides: 111 mg/dL (ref <150)

## 2024-01-14 LAB — MICROALBUMIN / CREATININE URINE RATIO
Creatinine, Urine: 70 mg/dL (ref 20–320)
Microalb Creat Ratio: 13 mg/g{creat} (ref <30)
Microalb, Ur: 0.9 mg/dL

## 2024-01-14 LAB — HEMOGLOBIN A1C
Hgb A1c MFr Bld: 8.3 % — ABNORMAL HIGH (ref <5.7)
Mean Plasma Glucose: 192 mg/dL
eAG (mmol/L): 10.6 mmol/L

## 2024-01-15 ENCOUNTER — Ambulatory Visit: Admitting: Endocrinology

## 2024-01-15 ENCOUNTER — Encounter: Payer: Self-pay | Admitting: Endocrinology

## 2024-01-15 VITALS — BP 132/80 | HR 90 | Resp 20 | Ht 72.0 in | Wt 165.6 lb

## 2024-01-15 DIAGNOSIS — E1165 Type 2 diabetes mellitus with hyperglycemia: Secondary | ICD-10-CM | POA: Diagnosis not present

## 2024-01-15 DIAGNOSIS — Z7985 Long-term (current) use of injectable non-insulin antidiabetic drugs: Secondary | ICD-10-CM

## 2024-01-15 DIAGNOSIS — Z7984 Long term (current) use of oral hypoglycemic drugs: Secondary | ICD-10-CM

## 2024-01-15 MED ORDER — TIRZEPATIDE 2.5 MG/0.5ML ~~LOC~~ SOAJ: 2.5000 mg | SUBCUTANEOUS | 0 refills | Status: DC

## 2024-01-15 MED ORDER — GLIMEPIRIDE 1 MG PO TABS: 1.0000 mg | ORAL_TABLET | Freq: Every day | ORAL | 3 refills | Status: AC

## 2024-01-15 MED ORDER — TIRZEPATIDE 5 MG/0.5ML ~~LOC~~ SOAJ: 5.0000 mg | SUBCUTANEOUS | 4 refills | Status: DC

## 2024-01-15 NOTE — Progress Notes (Signed)
 Outpatient Endocrinology Note [name], MD  01/15/24  Patient's Name: Juan Morales    DOB: [birth year]    MRN: [id number]                                                    REASON OF VISIT: Follow up for type 2 diabetes mellitus  PCP: Seabron Lenis, MD  HISTORY OF PRESENT ILLNESS:   Juan Morales is a 78 y.o. old male with past medical history listed below, is here for follow up for type 2 diabetes mellitus.   Pertinent Diabetes History: Patient was diagnosed with prediabetes in 2003. Previously had relatively mild diabetes which was previously well-controlled with low-dose Jentadueto or Janumet  XR. His A1c had been usually under 7%. When his A1c had gone up to 7.6% in 7/15 he was started on Trulicity   0.75 mg weekly, later switched to Mounjaro .  In 2018 Amaryl  was started.  Chronic Diabetes Complications : Retinopathy: no. Last ophthalmology exam was done on annually, following with ophthalmology regularly.  Nephropathy: no Peripheral neuropathy: no Coronary artery disease: no Stroke: no  Relevant comorbidities and cardiovascular risk factors: Obesity: on Body mass index is 22.46 kg/m.  Hypertension: no Hyperlipidemia : Yes, on statin   Current / Home Diabetic regimen includes:  Amaryl  2 mg at dinner. Metformin  ER 750 mg two times a day.   Prior diabetic medications: Trulicity  in the past changed to Mounjaro  in May 2024.  Janumet  in the past.  Mounjaro  was stopped in April 2025 due to concern of weight loss.  Glycemic data:    He has Accu-Chek glucometer checking daily in the morning fasting, download from October 8 to January 15, 2024, average blood sugar 155, lowest blood sugar 111, highest blood sugar 199.  Some of the blood sugar 157, 155, 199, 111, 135, 168, 143, 133.   Hypoglycemia: Patient has no hypoglycemic episodes. Patient has hypoglycemia awareness.  Factors modifying glucose control: 1.  Diabetic diet assessment: 3 meals a day.  2.  Staying active or  exercising: Walking/exercise started.  3.  Medication compliance: compliant all of the time.  Interval history  Recent laboratory results reviewed, he has worsening diabetes control with hemoglobin A1c of 8.3%, increased from 6.5% in last visit.  He has normal renal function.  Mildly elevated potassium.  Cholesterol level mild elevated LDL of 107.  Urine microalbumin creatinine ratio normal.  Normal electrolytes.  Mounjaro  was stopped in the last visit due to concern of weight loss.  He has increased the dose of glimepiride .  Recently he was evaluated for BPH by urology and started on medication including finasteride and he has noticed increasing blood sugar after that.  He reports he has also been not walking much lately.  He has noticed mild nodular to the area on the left foot, was referred to podiatry prior to this visit few days ago.  No redness, pain or discharge.  No trauma.  After not being on Mounjaro  he has also gained about 12 pounds of weight.  No other complaints today.  REVIEW OF SYSTEMS As per history of present illness.   PAST MEDICAL HISTORY: Past Medical History:  Diagnosis Date   Arthritis    Diabetes mellitus without complication (HCC)    Erectile dysfunction    Hyperlipidemia    Macular degeneration  PAST SURGICAL HISTORY: Past Surgical History:  Procedure Laterality Date   ACHILLES TENDON LENGTHENING Right 09/06/2022   Procedure: ACHILLES TENDON LENGTHENING;  Surgeon: Kit Rush, MD;  Location: [facility];  Service: Orthopedics;  Laterality: Right;   ANKLE RECONSTRUCTION  09/06/2022   Procedure: LATERAL LIGAMENT RECONSTRUCTION;  Surgeon: Kit Rush, MD;  Location: [facility];  Service: Orthopedics;;   arthroscopic knee Left    times 2   EYE SURGERY Bilateral    cataract removal   PARTIAL KNEE ARTHROPLASTY Left 07/26/2015   Procedure: LEFT UNICOMPARTMENTAL KNEE ARTHROPLASTY;  Surgeon: Lonni CINDERELLA Poli, MD;   Location: [facility] OR;  Service: Orthopedics;  Laterality: Left;   TOTAL ANKLE ARTHROPLASTY Right 09/06/2022   Procedure: RIGHT TOTAL ANKLE ARTHROPLASTY;  Surgeon: Kit Rush, MD;  Location: [facility];  Service: Orthopedics;  Laterality: Right;    ALLERGIES: No Known Allergies  FAMILY HISTORY:  Family History  Problem Relation Age of Onset   Diabetes Neg Hx    Heart disease Neg Hx    Stroke Neg Hx    Hypertension Father     SOCIAL HISTORY: Social History   Socioeconomic History   Marital status: Married    Spouse name: Not on file   Number of children: Not on file   Years of education: Not on file   Highest education level: Not on file  Occupational History   Not on file  Tobacco Use   Smoking status: Never   Smokeless tobacco: Never  Substance and Sexual Activity   Alcohol use: No   Drug use: No   Sexual activity: Not on file  Other Topics Concern   Not on file  Social History Narrative   Not on file   Social Drivers of Health   Financial Resource Strain: Not on file  Food Insecurity: Not on file  Transportation Needs: Not on file  Physical Activity: Not on file  Stress: Not on file  Social Connections: Not on file    MEDICATIONS:  Current Outpatient Medications  Medication Sig Dispense Refill   finasteride (PROSCAR) 5 MG tablet 5 mg.     glucose blood (ACCU-CHEK GUIDE) test strip Use accu chek guide test strips as instructed to check blood sugar three times daily. 300 each 3   glucose blood test strip Use as instructed 100 each 12   metFORMIN  (GLUCOPHAGE -XR) 750 MG 24 hr tablet TAKE 1 TABLET TWICE DAILY 180 tablet 3   Multiple Vitamins-Minerals (MULTIVITAMIN WITH MINERALS) tablet Take 1 tablet by mouth daily.     Multiple Vitamins-Minerals (PRESERVISION AREDS) TABS See admin instructions.     pravastatin  (PRAVACHOL ) 40 MG tablet TAKE 1 TABLET EVERY DAY 90 tablet 3   sildenafil  (VIAGRA ) 100 MG tablet Take 1 tablet (100 mg total) by mouth daily  as needed for erectile dysfunction. 40 tablet 0   solifenacin (VESICARE) 10 MG tablet Take 10 mg by mouth daily.     tirzepatide  (MOUNJARO ) 2.5 MG/0.5ML Pen Inject 2.5 mg into the skin once a week. 2 mL 0   tirzepatide  (MOUNJARO ) 5 MG/0.5ML Pen Inject 5 mg into the skin once a week. After completion of 4 weeks of 2.5mg  dose. 6 mL 4   glimepiride  (AMARYL ) 1 MG tablet Take 1 tablet (1 mg total) by mouth daily. 90 tablet 3   rivaroxaban  (XARELTO ) 10 MG TABS tablet Take 1 tablet (10 mg total) by mouth daily. 14 tablet 0   senna (SENOKOT) 8.6 MG TABS tablet Take 2  tablets (17.2 mg total) by mouth 2 (two) times daily. 30 tablet 0   No current facility-administered medications for this visit.    PHYSICAL EXAM: Vitals:   01/15/24 1600  BP: 132/80  Pulse: 90  Resp: 20  SpO2: 99%  Weight: 165 lb 9.6 oz (75.1 kg)  Height: 6' (1.829 m)    Body mass index is 22.46 kg/m.  Wt Readings from Last 3 Encounters:  01/15/24 165 lb 9.6 oz (75.1 kg)  07/15/23 157 lb (71.2 kg)  02/26/23 162 lb 12.8 oz (73.8 kg)    General: Well developed, well nourished male in no apparent distress.  HEENT: AT/[place], no external lesions.  Eyes: Conjunctiva clear and no icterus. Neck: Neck supple  Lungs: Respirations not labored Neurologic: Alert, oriented, normal speech Extremities / Skin: Dry.  Psychiatric: Does not appear depressed or anxious  Diabetic Foot Exam - Simple   Simple Foot Form Visual Inspection See comments: Yes Sensation Testing Intact to touch and monofilament testing bilaterally: Yes Pulse Check Posterior Tibialis and Dorsalis pulse intact bilaterally: Yes Comments Hammertoes deformity bilaterally more on right.  Left foot : On sole laterally small nodular area, callus versus corn, nontender, no erythema and no discharge, no signs of infection.  No ulcer.    LABS Reviewed Lab Results  Component Value Date   HGBA1C 8.3 (H) 01/13/2024   HGBA1C 6.5 (H) 07/11/2023   HGBA1C 6.5 (H) 02/19/2023    Lab Results  Component Value Date   FRUCTOSAMINE 283 (H) 10/21/2013   Lab Results  Component Value Date   CHOL 199 01/13/2024   HDL 71 01/13/2024   LDLCALC 107 (H) 01/13/2024   LDLDIRECT 86.0 11/05/2022   TRIG 111 01/13/2024   CHOLHDL 2.8 01/13/2024   Lab Results  Component Value Date   MICRALBCREAT 13 01/13/2024   MICRALBCREAT 3 02/19/2023   Lab Results  Component Value Date   CREATININE 0.98 01/13/2024   Lab Results  Component Value Date   GFR 65.50 11/05/2022    ASSESSMENT / PLAN  1. Uncontrolled type 2 diabetes mellitus with hyperglycemia, without long-term current use of insulin  (HCC)    Diabetes Mellitus type 2, complicated by no other known complications. - Diabetic status / severity: uncontrolled.  Worsening.  Lab Results  Component Value Date   HGBA1C 8.3 (H) 01/13/2024    - Hemoglobin A1c goal : <6.5%  He has worsening diabetes control.  - Medications: See below.  I) restart Mounjaro , 2.5 mg weekly for 4 weeks and increase to 5 mg weekly.   II) continue metformin  extended release 750 mg 2 times a day. III) decrease glimepiride  from 2 mg to 1 mg daily.  He has been taking with supper.  - Home glucose testing: In the morning fasting daily and at bedtime. - Discussed/ Gave Hypoglycemia treatment plan.  # Consult : not required at this time.   # Annual urine for microalbuminuria/ creatinine ratio, no microalbuminuria currently. Last  Lab Results  Component Value Date   MICRALBCREAT 13 01/13/2024    # Foot check nightly.  He has left foot with corn/callus area, referred to podiatry.  No signs of infection at this time.  # Annual dilated diabetic eye exams.   - Diet: Make healthy diabetic food choices - Life style / activity / exercise: Discussed.  He has planned to start walking more.  2. Blood pressure  -  BP Readings from Last 1 Encounters:  01/15/24 132/80    - Control is in target.  -  No change in current plans.  3. Lipid status  / Hyperlipidemia - Last  Lab Results  Component Value Date   LDLCALC 107 (H) 01/13/2024   - Continue pravastatin  40 mg daily.  LDL acceptable we will recheck in about 3 to 6 months and if remains elevated we will switch statin.   Diagnoses and all orders for this visit:  Uncontrolled type 2 diabetes mellitus with hyperglycemia, without long-term current use of insulin  (HCC) -     tirzepatide  (MOUNJARO ) 2.5 MG/0.5ML Pen; Inject 2.5 mg into the skin once a week. -     tirzepatide  (MOUNJARO ) 5 MG/0.5ML Pen; Inject 5 mg into the skin once a week. After completion of 4 weeks of 2.5mg  dose. -     glimepiride  (AMARYL ) 1 MG tablet; Take 1 tablet (1 mg total) by mouth daily. -     Basic metabolic panel with GFR -     Hemoglobin A1c    DISPOSITION Follow up in clinic in 3  months suggested.  Labs prior to follow-up visit as ordered.   All questions answered and patient verbalized understanding of the plan.  [place] Koriana Stepien, MD [facility] [address] [facility] [address] [place], [address] [place], [place] [address] Phone # [phone]  At least part of this note was generated using voice recognition software. Inadvertent word errors may have occurred, which were not recognized during the proofreading process.

## 2024-01-15 NOTE — Patient Instructions (Signed)
 Diabetes regimen  Start Mounjaro  2.5 mg weekly for 4 weeks and increase to 5 mg weekly.  Decrease glimepiride  from 2 mg to 1 mg daily with supper.  Continue current dose of metformin .

## 2024-01-20 ENCOUNTER — Encounter: Payer: Self-pay | Admitting: Podiatry

## 2024-01-20 ENCOUNTER — Ambulatory Visit: Admitting: Podiatry

## 2024-01-20 ENCOUNTER — Ambulatory Visit (INDEPENDENT_AMBULATORY_CARE_PROVIDER_SITE_OTHER)

## 2024-01-20 VITALS — Ht 72.0 in | Wt 165.6 lb

## 2024-01-20 DIAGNOSIS — D2372 Other benign neoplasm of skin of left lower limb, including hip: Secondary | ICD-10-CM

## 2024-01-20 DIAGNOSIS — M7751 Other enthesopathy of right foot: Secondary | ICD-10-CM

## 2024-01-20 DIAGNOSIS — M7752 Other enthesopathy of left foot: Secondary | ICD-10-CM | POA: Diagnosis not present

## 2024-01-27 NOTE — Progress Notes (Signed)
   Chief Complaint  Patient presents with   Foot Pain    Pt is here due to left foot pain, pain is at the bottom of the foot, feels like something is sticking into the foot.    HPI: 78 y.o. male presenting today for onset of left foot pain.  Idiopathic onset.  No history of injury  Past Medical History:  Diagnosis Date   Arthritis    Diabetes mellitus without complication (HCC)    Erectile dysfunction    Hyperlipidemia    Macular degeneration     Past Surgical History:  Procedure Laterality Date   ACHILLES TENDON LENGTHENING Right 09/06/2022   Procedure: ACHILLES TENDON LENGTHENING;  Surgeon: Kit Rush, MD;  Location: [facility];  Service: Orthopedics;  Laterality: Right;   ANKLE RECONSTRUCTION  09/06/2022   Procedure: LATERAL LIGAMENT RECONSTRUCTION;  Surgeon: Kit Rush, MD;  Location: [facility];  Service: Orthopedics;;   arthroscopic knee Left    times 2   EYE SURGERY Bilateral    cataract removal   PARTIAL KNEE ARTHROPLASTY Left 07/26/2015   Procedure: LEFT UNICOMPARTMENTAL KNEE ARTHROPLASTY;  Surgeon: Lonni CINDERELLA Poli, MD;  Location: [facility];  Service: Orthopedics;  Laterality: Left;   TOTAL ANKLE ARTHROPLASTY Right 09/06/2022   Procedure: RIGHT TOTAL ANKLE ARTHROPLASTY;  Surgeon: Kit Rush, MD;  Location: [facility];  Service: Orthopedics;  Laterality: Right;    No Known Allergies   Physical Exam: General: The patient is alert and oriented x3 in no acute distress.  Dermatology: Skin is warm, dry and supple bilateral lower extremities.  Hyperkeratotic skin lesion noted with a central nucleated core  Vascular: Palpable pedal pulses bilaterally. Capillary refill within normal limits.  No appreciable edema.  No erythema.  Neurological: Grossly intact via light touch  Musculoskeletal Exam: No pedal deformities noted.  Associated tenderness to palpation of the skin lesion.  History of left ankle arthroplasty with  implant  Radiographic Exam LT foot 01/20/2024:  Normal osseous mineralization. Joint spaces preserved.  No fractures or osseous irregularities noted.  Assessment/Plan of Care: 1.  Eccrine poroma left  -Patient evaluated.  X-rays reviewed -Excisional debridement of the lesion was performed today using 312 scalpel without incident or bleeding. -Advised against going barefoot.  Recommend good supportive tennis shoes and sneakers -Return to clinic PRN       Thresa EMERSON Sar, DPM [facility]  Dr. Thresa EMERSON Sar, DPM    [address] [place], [place] [address]                Office [phone]  Fax [phone]

## 2024-02-02 ENCOUNTER — Other Ambulatory Visit: Payer: Self-pay | Admitting: Endocrinology

## 2024-02-02 DIAGNOSIS — E1165 Type 2 diabetes mellitus with hyperglycemia: Secondary | ICD-10-CM

## 2024-02-02 DIAGNOSIS — E7800 Pure hypercholesterolemia, unspecified: Secondary | ICD-10-CM

## 2024-04-13 ENCOUNTER — Other Ambulatory Visit

## 2024-04-14 ENCOUNTER — Ambulatory Visit: Payer: Self-pay | Admitting: Endocrinology

## 2024-04-14 LAB — BASIC METABOLIC PANEL WITH GFR
BUN: 20 mg/dL (ref 7–25)
CO2: 32 mmol/L (ref 20–32)
Calcium: 9.8 mg/dL (ref 8.6–10.3)
Chloride: 103 mmol/L (ref 98–110)
Creat: 0.91 mg/dL (ref 0.70–1.28)
Glucose, Bld: 152 mg/dL — ABNORMAL HIGH (ref 65–99)
Potassium: 4 mmol/L (ref 3.5–5.3)
Sodium: 142 mmol/L (ref 135–146)
eGFR: 86 mL/min/1.73m2

## 2024-04-14 LAB — HEMOGLOBIN A1C
Hgb A1c MFr Bld: 6.4 % — ABNORMAL HIGH
Mean Plasma Glucose: 137 mg/dL
eAG (mmol/L): 7.6 mmol/L

## 2024-04-16 ENCOUNTER — Ambulatory Visit: Admitting: Endocrinology

## 2024-04-16 ENCOUNTER — Encounter: Payer: Self-pay | Admitting: Endocrinology

## 2024-04-16 VITALS — BP 126/80 | HR 82 | Resp 20 | Ht 72.0 in | Wt 162.4 lb

## 2024-04-16 DIAGNOSIS — N529 Male erectile dysfunction, unspecified: Secondary | ICD-10-CM | POA: Diagnosis not present

## 2024-04-16 DIAGNOSIS — Z7985 Long-term (current) use of injectable non-insulin antidiabetic drugs: Secondary | ICD-10-CM | POA: Diagnosis not present

## 2024-04-16 DIAGNOSIS — Z7984 Long term (current) use of oral hypoglycemic drugs: Secondary | ICD-10-CM

## 2024-04-16 DIAGNOSIS — E119 Type 2 diabetes mellitus without complications: Secondary | ICD-10-CM

## 2024-04-16 MED ORDER — TIRZEPATIDE 2.5 MG/0.5ML ~~LOC~~ SOAJ: 2.5000 mg | SUBCUTANEOUS | 3 refills | Status: AC

## 2024-04-16 MED ORDER — SILDENAFIL CITRATE 100 MG PO TABS: 100.0000 mg | ORAL_TABLET | Freq: Every day | ORAL | 0 refills | Status: AC | PRN

## 2024-04-16 NOTE — Progress Notes (Signed)
 "  Outpatient Endocrinology Note Laquita Harlan, MD  04/16/24  Patient's Name: Juan Morales    DOB: 09/04/1945    MRN: [id number]                                                    REASON OF VISIT: Follow up for type 2 diabetes mellitus  PCP: Seabron Lenis, MD  HISTORY OF PRESENT ILLNESS:   Juan Morales is a 79 y.o. old male with past medical history listed below, is here for follow up for type 2 diabetes mellitus.   Pertinent Diabetes History: Patient was diagnosed with prediabetes in 2003. Previously had relatively mild diabetes which was previously well-controlled with low-dose Jentadueto or Janumet XR. His A1c had been usually under 7%. When his A1c had gone up to 7.6% in 7/15 he was started on Trulicity   0.75 mg weekly, later switched to Mounjaro .  In 2018 Amaryl  was started.  Chronic Diabetes Complications : Retinopathy: no. Last ophthalmology exam was done on annually, following with ophthalmology regularly.  Nephropathy: no Peripheral neuropathy: mild yes.  History of right ankle replacement. Coronary artery disease: no Stroke: no  Relevant comorbidities and cardiovascular risk factors: Obesity: on Body mass index is 22.03 kg/m.  Hypertension: no Hyperlipidemia : Yes, on statin   Current / Home Diabetic regimen includes:  Mounjaro  5 mg weekly. Amaryl  1 mg at dinner. Metformin  ER 750 mg two times a day.   Prior diabetic medications: Trulicity  in the past changed to Mounjaro  in May 2024.  Janumet in the past.  Mounjaro  was stopped in April 2025 due to concern of weight loss.  Glycemic data:    He has Accu-Chek glucometer checking daily in the morning fasting and occasionally in the afternoon and evening, download from December 24 to April 16, 2024, average blood sugar 116, acceptable blood sugar in the morning fasting and in the afternoon and at bedtime.  Hypoglycemia: Patient has no hypoglycemic episodes. Patient has hypoglycemia awareness.  Factors modifying  glucose control: 1.  Diabetic diet assessment: 3 meals a day.  2.  Staying active or exercising: Walking/exercise started.  3.  Medication compliance: compliant all of the time.  Interval history  Diabetes regimen is reviewed and noted above.  Glucometer data as reviewed above, mostly acceptable blood sugar.  Hemoglobin A1c 6.4%.  Recent lab results with normal electrolytes and renal function.  He has been slowly losing mild weight.  Denies GI issues on taking Mounjaro .  He does not want to lose any more weight and wants to decrease the dose of Mounjaro .  No numbness and ting of the feet.  No vision problem.  No other complaints today.  REVIEW OF SYSTEMS As per history of present illness.   PAST MEDICAL HISTORY: Past Medical History:  Diagnosis Date   Arthritis    Diabetes mellitus without complication (HCC)    Erectile dysfunction    Hyperlipidemia    Macular degeneration     PAST SURGICAL HISTORY: Past Surgical History:  Procedure Laterality Date   ACHILLES TENDON LENGTHENING Right 09/06/2022   Procedure: ACHILLES TENDON LENGTHENING;  Surgeon: Kit Rush, MD;  Location: [facility];  Service: Orthopedics;  Laterality: Right;   ANKLE RECONSTRUCTION  09/06/2022   Procedure: LATERAL LIGAMENT RECONSTRUCTION;  Surgeon: Kit Rush, MD;  Location: [facility];  Service:  Orthopedics;;   arthroscopic knee Left    times 2   EYE SURGERY Bilateral    cataract removal   PARTIAL KNEE ARTHROPLASTY Left 07/26/2015   Procedure: LEFT UNICOMPARTMENTAL KNEE ARTHROPLASTY;  Surgeon: Lonni CINDERELLA Poli, MD;  Location: [facility] OR;  Service: Orthopedics;  Laterality: Left;   TOTAL ANKLE ARTHROPLASTY Right 09/06/2022   Procedure: RIGHT TOTAL ANKLE ARTHROPLASTY;  Surgeon: Kit Rush, MD;  Location: [facility];  Service: Orthopedics;  Laterality: Right;    ALLERGIES: No Known Allergies  FAMILY HISTORY:  Family History  Problem Relation Age of Onset    Diabetes Neg Hx    Heart disease Neg Hx    Stroke Neg Hx    Hypertension Father     SOCIAL HISTORY: Social History   Socioeconomic History   Marital status: Married    Spouse name: Not on file   Number of children: Not on file   Years of education: Not on file   Highest education level: Not on file  Occupational History   Not on file  Tobacco Use   Smoking status: Never   Smokeless tobacco: Never  Substance and Sexual Activity   Alcohol use: No   Drug use: No   Sexual activity: Not on file  Other Topics Concern   Not on file  Social History Narrative   Not on file   Social Drivers of Health   Tobacco Use: Low Risk (04/16/2024)   Patient History    Smoking Tobacco Use: Never    Smokeless Tobacco Use: Never    Passive Exposure: Not on file  Financial Resource Strain: Not on file  Food Insecurity: Not on file  Transportation Needs: Not on file  Physical Activity: Not on file  Stress: Not on file  Social Connections: Not on file  Depression (EYV7-0): Not on file  Alcohol Screen: Not on file  Housing: Not on file  Utilities: Not on file  Health Literacy: Not on file    MEDICATIONS:  Current Outpatient Medications  Medication Sig Dispense Refill   finasteride (PROSCAR) 5 MG tablet 5 mg.     glimepiride  (AMARYL ) 1 MG tablet Take 1 tablet (1 mg total) by mouth daily. 90 tablet 3   glucose blood (ACCU-CHEK GUIDE) test strip Use accu chek guide test strips as instructed to check blood sugar three times daily. 300 each 3   glucose blood test strip Use as instructed 100 each 12   metFORMIN  (GLUCOPHAGE -XR) 750 MG 24 hr tablet TAKE 1 TABLET TWICE DAILY 180 tablet 3   Multiple Vitamins-Minerals (MULTIVITAMIN WITH MINERALS) tablet Take 1 tablet by mouth daily.     Multiple Vitamins-Minerals (PRESERVISION AREDS) TABS See admin instructions.     pravastatin  (PRAVACHOL ) 40 MG tablet TAKE 1 TABLET EVERY DAY 90 tablet 3   solifenacin (VESICARE) 10 MG tablet Take 10 mg by mouth  daily.     tirzepatide  (MOUNJARO ) 2.5 MG/0.5ML Pen Inject 2.5 mg into the skin once a week. 6 mL 3   sildenafil  (VIAGRA ) 100 MG tablet Take 1 tablet (100 mg total) by mouth daily as needed for erectile dysfunction. 40 tablet 0   No current facility-administered medications for this visit.    PHYSICAL EXAM: Vitals:   04/16/24 1604  BP: 126/80  Pulse: 82  Resp: 20  SpO2: 95%  Weight: 162 lb 6.4 oz (73.7 kg)  Height: 6' (1.829 m)    Body mass index is 22.03 kg/m.  Wt Readings from Last 3 Encounters:  04/16/24  162 lb 6.4 oz (73.7 kg)  01/20/24 165 lb 9.6 oz (75.1 kg)  01/15/24 165 lb 9.6 oz (75.1 kg)    General: Well developed, well nourished male in no apparent distress.  HEENT: AT/[place], no external lesions.  Eyes: Conjunctiva clear and no icterus. Neck: Neck supple  Lungs: Respirations not labored Neurologic: Alert, oriented, normal speech Extremities / Skin: Dry.  Psychiatric: Does not appear depressed or anxious  Diabetic Foot Exam - Simple   Simple Foot Form Diabetic Foot exam was performed with the following findings: Yes 04/16/2024  4:18 PM  Visual Inspection See comments: Yes Sensation Testing Intact to touch and monofilament testing bilaterally: Yes Pulse Check Posterior Tibialis and Dorsalis pulse intact bilaterally: Yes Comments Bilateral hammer toes R > L.     LABS Reviewed Lab Results  Component Value Date   HGBA1C 6.4 (H) 04/13/2024   HGBA1C 8.3 (H) 01/13/2024   HGBA1C 6.5 (H) 07/11/2023   Lab Results  Component Value Date   FRUCTOSAMINE 283 (H) 10/21/2013   Lab Results  Component Value Date   CHOL 199 01/13/2024   HDL 71 01/13/2024   LDLCALC 107 (H) 01/13/2024   LDLDIRECT 86.0 11/05/2022   TRIG 111 01/13/2024   CHOLHDL 2.8 01/13/2024   Lab Results  Component Value Date   MICRALBCREAT 13 01/13/2024   MICRALBCREAT 3 02/19/2023   Lab Results  Component Value Date   CREATININE 0.91 04/13/2024   Lab Results  Component Value Date   GFR  65.50 11/05/2022    ASSESSMENT / PLAN  1. Controlled type 2 diabetes mellitus without complication, without long-term current use of insulin  (HCC)   2. Erectile dysfunction, unspecified erectile dysfunction type     Diabetes Mellitus type 2, complicated by no other known complications. - Diabetic status / severity: Controlled, improved.  Lab Results  Component Value Date   HGBA1C 6.4 (H) 04/13/2024    - Hemoglobin A1c goal : <6.5%  Diabetes control has improved, congratulated him.  - Medications: See below.  I) decrease Mounjaro  from 5 to 2.5 mg weekly with a concern of continued weight loss.SABRA   II) continue metformin  extended release 750 mg 2 times a day. III) continue glimepiride  1 mg daily.  Consider restarting in the future.   - Home glucose testing: In the morning fasting daily and at bedtime.  - Discussed/ Gave Hypoglycemia treatment plan.  # Consult : not required at this time.   # Annual urine for microalbuminuria/ creatinine ratio, no microalbuminuria currently. Last  Lab Results  Component Value Date   MICRALBCREAT 13 01/13/2024    # Foot check nightly.  He had left foot with corn/callus area, referred to podiatry.  No signs of infection at this time.  # Annual dilated diabetic eye exams.   - Diet: Make healthy diabetic food choices - Life style / activity / exercise: Discussed.  2. Blood pressure  -  BP Readings from Last 1 Encounters:  04/16/24 126/80    - Control is in target.  - No change in current plans.  3. Lipid status / Hyperlipidemia - Last  Lab Results  Component Value Date   LDLCALC 107 (H) 01/13/2024   - Continue pravastatin  40 mg daily.   # Erectile dysfunction - He has been taking sildenafil  as needed.  Requested refill, paper prescription provided.   Diagnoses and all orders for this visit:  Controlled type 2 diabetes mellitus without complication, without long-term current use of insulin  (HCC) -  tirzepatide   (MOUNJARO ) 2.5 MG/0.5ML Pen; Inject 2.5 mg into the skin once a week.  Erectile dysfunction, unspecified erectile dysfunction type -     sildenafil  (VIAGRA ) 100 MG tablet; Take 1 tablet (100 mg total) by mouth daily as needed for erectile dysfunction.    DISPOSITION Follow up in clinic in 4 months suggested.  Labs on the same day of the visit.  All questions answered and patient verbalized understanding of the plan.  Kaylor Maiers, MD [facility] [address] [place], [address] [place], [place] [address] Phone # [phone]  At least part of this note was generated using voice recognition software. Inadvertent word errors may have occurred, which were not recognized during the proofreading process. "

## 2024-04-16 NOTE — Patient Instructions (Addendum)
"   Latest Reference Range & Units 01/13/24 08:09 04/13/24 08:34  Hemoglobin A1C <5.7 % 8.3 (H) 6.4 (H)  (H): Data is abnormally high  Diabetes regimen  Increase Mounjaro  2.5 mg weekly.  Glimepiride  1 mg daily with supper.  Continue current dose of metformin . "

## 2024-08-13 ENCOUNTER — Ambulatory Visit: Admitting: Endocrinology

## 2024-09-10 ENCOUNTER — Ambulatory Visit: Admitting: Dermatology
